# Patient Record
Sex: Female | Born: 1975 | Race: White | Hispanic: No | Smoking: Current every day smoker
Health system: Southern US, Community
[De-identification: ages and names within clinical notes are randomized; demographics above are authoritative.]

## PROBLEM LIST (undated history)

## (undated) ENCOUNTER — Inpatient Hospital Stay (HOSPITAL_COMMUNITY): Payer: Self-pay

## (undated) DIAGNOSIS — T7840XA Allergy, unspecified, initial encounter: Secondary | ICD-10-CM

## (undated) DIAGNOSIS — F32A Depression, unspecified: Secondary | ICD-10-CM

## (undated) DIAGNOSIS — E559 Vitamin D deficiency, unspecified: Secondary | ICD-10-CM

## (undated) DIAGNOSIS — R87629 Unspecified abnormal cytological findings in specimens from vagina: Secondary | ICD-10-CM

## (undated) DIAGNOSIS — F329 Major depressive disorder, single episode, unspecified: Secondary | ICD-10-CM

## (undated) DIAGNOSIS — F419 Anxiety disorder, unspecified: Secondary | ICD-10-CM

## (undated) DIAGNOSIS — Z8639 Personal history of other endocrine, nutritional and metabolic disease: Secondary | ICD-10-CM

## (undated) HISTORY — PX: CHOLECYSTECTOMY: SHX55

## (undated) HISTORY — DX: Vitamin D deficiency, unspecified: E55.9

## (undated) HISTORY — DX: Depression, unspecified: F32.A

## (undated) HISTORY — DX: Anxiety disorder, unspecified: F41.9

## (undated) HISTORY — DX: Unspecified abnormal cytological findings in specimens from vagina: R87.629

## (undated) HISTORY — DX: Major depressive disorder, single episode, unspecified: F32.9

## (undated) HISTORY — DX: Allergy, unspecified, initial encounter: T78.40XA

## (undated) HISTORY — DX: Personal history of other endocrine, nutritional and metabolic disease: Z86.39

---

## 2012-08-31 ENCOUNTER — Ambulatory Visit (INDEPENDENT_AMBULATORY_CARE_PROVIDER_SITE_OTHER): Payer: 59 | Admitting: Family Medicine

## 2012-08-31 VITALS — BP 118/90 | HR 86 | Temp 98.5°F | Resp 16 | Ht 67.0 in | Wt 230.0 lb

## 2012-08-31 DIAGNOSIS — Z3040 Encounter for surveillance of contraceptives, unspecified: Secondary | ICD-10-CM

## 2012-08-31 MED ORDER — NORETHINDRONE 0.35 MG PO TABS: 1.0000 | ORAL_TABLET | Freq: Every day | ORAL | Status: DC

## 2012-08-31 NOTE — Progress Notes (Signed)
[facility] [address] [place] [address] [phone]  Date:  08/31/2012   Name:  Lauren Bowers   DOB:  [birth year]   MRN:  [id number]  PCP:  No primary provider on file.    Chief Complaint: Contraception   History of Present Illness:  Lauren Bowers is a 37 y.o. very pleasant female patient who presents with the following:  She is here today for a refill of her OCP.  She is trying to establish with a general doctor.  Normal pap last year- she did have an abnormal many years ago, but several normal since then.   She is still smoking - about 5 cigarettes a day- she is not ready to quit right now. She had heard that smoking over the age of 2 could increase her risks with OCP use and is concerned about this.  She is also interested in possibly pursuing a more permament form of contraception such as an IUD or tubal ligation.    She has a 38 year old daughter, and then had 2 still births. She does not want to become pregnant again for this reason.    Otherwise she is generally healthy  There are no active problems to display for this patient.   Past Medical History  Diagnosis Date  . Allergy   . Anxiety   . Depression     Past Surgical History  Procedure Laterality Date  . Cholecystectomy      History  Substance Use Topics  . Smoking status: Current Every Day Smoker -- 0.50 packs/day    Types: Cigarettes  . Smokeless tobacco: Not on file  . Alcohol Use: Yes    Family History  Problem Relation Age of Onset  . Heart disease Father   . Ovarian cancer Maternal Grandmother   . Heart disease Maternal Grandfather     Allergies  Allergen Reactions  . Bactrim (Sulfamethoxazole W-Trimethoprim) Hives    Medication list has been reviewed and updated.  No current outpatient prescriptions on file prior to visit.   No current facility-administered medications on file prior to visit.    Review of Systems:  As per HPI- otherwise  negative.   Physical Examination: Filed Vitals:   08/31/12 1221  BP: 118/90  Pulse: 86  Temp: 98.5 F (36.9 C)  Resp: 16   Filed Vitals:   08/31/12 1221  Height: 5\' 7"  (1.702 m)  Weight: 230 lb (104.327 kg)   Body mass index is 36.01 kg/(m^2). Ideal Body Weight: Weight in (lb) to have BMI = 25: 159.3  GEN: WDWN, NAD, Non-toxic, A & O x 3, obese HEENT: Atraumatic, Normocephalic. Neck supple. No masses, No LAD. Ears and Nose: No external deformity. CV: RRR, No M/G/R. No JVD. No thrill. No extra heart sounds. PULM: CTA B, no wheezes, crackles, rhonchi. No retractions. No resp. distress. No accessory muscle use. EXTR: No c/c/e NEURO Normal gait.  PSYCH: Normally interactive. Conversant. Not depressed or anxious appearing.  Calm demeanor.    Assessment and Plan: Contraceptive use - Plan: norethindrone (ORTHO MICRONOR) 0.35 MG tablet  Went over WHO tables- use of combined pill in smokers over 19 years old gets a category 3, meaning that the dangers usually outweigh benefits.  Went over other options including depo and POP.  She would like to use the progesterone only pill, and understands that it can be slightly less effective that the combined pill.  Encouraged condom use as well.   See patient instructions  for more details.     Signed Abbe Amsterdam, MD

## 2012-08-31 NOTE — Patient Instructions (Addendum)
Switch to the progesterone only pill at the time you would start your next pack. Remember that this pill is more sensitive, and needs to be taken at the same time daily without fail.  I would recommend that you use condoms as well (especailly the first month) if avoiding pregnancy is very important.  If you are interested in something more permanent, please consult with an OB- GYN  Some good options in town . . .  [facility]  You can also consult the world health organization tables (avaialbe online) regarding which contraceptive options are best for you.    Please work on quitting smoking!  If you are able to quit and want to go back on a combined pill please let me know.

## 2012-09-19 ENCOUNTER — Telehealth: Payer: Self-pay

## 2012-09-19 NOTE — Telephone Encounter (Signed)
Called her back- no answer but LMOM.  I will try her again tomorrow

## 2012-09-19 NOTE — Telephone Encounter (Signed)
Do you want to change her OCP? Or have her continue and give more time?

## 2012-09-19 NOTE — Telephone Encounter (Signed)
Lauren Bowers is calling because she was seen by Dr Patsy Lager not long ago and her birth control was switched now she is on her 2nd week of her period is the way she put it and she is wanting to know if this is normal  Call back number is [phone]

## 2012-10-17 ENCOUNTER — Telehealth: Payer: Self-pay

## 2012-10-17 NOTE — Telephone Encounter (Signed)
Called her- LMOM.  Will try her tomorrow

## 2012-10-17 NOTE — Telephone Encounter (Signed)
Patient is having her period for second month in row after starting new medication of BCP.    [phone]

## 2012-10-18 NOTE — Telephone Encounter (Signed)
Called her back.  She has bleeding every 2 weeks since she changed to the progesterone OCP.  She has not missed any doses and is otherwise doing well. She would like to quit smoking so she can go back on a combined OCP. She will contact her insurance company re: chantix and let me know if she wants me to rx this for her.  Let her know that I am happy to put her back on a combined OCP when she has quit smoking- she is motivated to quit.

## 2013-02-05 ENCOUNTER — Ambulatory Visit (INDEPENDENT_AMBULATORY_CARE_PROVIDER_SITE_OTHER): Payer: 59 | Admitting: General Practice

## 2013-02-05 ENCOUNTER — Encounter (INDEPENDENT_AMBULATORY_CARE_PROVIDER_SITE_OTHER): Payer: Self-pay

## 2013-02-05 VITALS — BP 138/92 | HR 88 | Temp 98.3°F | Ht 68.0 in | Wt 232.0 lb

## 2013-02-05 DIAGNOSIS — N390 Urinary tract infection, site not specified: Secondary | ICD-10-CM

## 2013-02-05 DIAGNOSIS — R300 Dysuria: Secondary | ICD-10-CM

## 2013-02-05 LAB — POCT UA - MICROSCOPIC ONLY

## 2013-02-05 LAB — POCT URINALYSIS DIPSTICK
Bilirubin, UA: NEGATIVE
Glucose, UA: NEGATIVE
Ketones, UA: NEGATIVE
Spec Grav, UA: 1.01

## 2013-02-05 MED ORDER — CIPROFLOXACIN HCL 500 MG PO TABS: 500.0000 mg | ORAL_TABLET | Freq: Two times a day (BID) | ORAL | Status: DC

## 2013-02-05 NOTE — Progress Notes (Signed)
  Subjective:    Patient ID: Lauren Bowers, female    DOB: 1975-12-19, 37 y.o.   MRN: [id number]  Urinary Tract Infection  This is a new problem. The current episode started 1 to 4 weeks ago. The problem occurs every urination. The problem has been unchanged. The quality of the pain is described as aching and burning. The pain is at a severity of 2/10. There has been no fever. She is sexually active. There is no history of pyelonephritis. Associated symptoms include frequency and urgency. Pertinent negatives include no chills, flank pain or hematuria. She has tried nothing for the symptoms. Her past medical history is significant for recurrent UTIs. history of UTI years ago      Review of Systems  Constitutional: Negative for fever and chills.  Respiratory: Negative for chest tightness and shortness of breath.   Cardiovascular: Negative for chest pain and palpitations.  Gastrointestinal: Positive for abdominal pain.  Genitourinary: Positive for urgency and frequency. Negative for hematuria and flank pain.  Neurological: Negative for dizziness, weakness and headaches.       Objective:   Physical Exam  Constitutional: She is oriented to person, place, and time. She appears well-developed and well-nourished.  Cardiovascular: Normal rate, regular rhythm and normal heart sounds.   Pulmonary/Chest: Effort normal and breath sounds normal.  Abdominal: Soft. Bowel sounds are normal. She exhibits no distension. There is tenderness in the suprapubic area. There is no CVA tenderness.  Neurological: She is alert and oriented to person, place, and time.  Skin: Skin is warm and dry.  Psychiatric: She has a normal mood and affect.    Results for orders placed in visit on 02/05/13  POCT URINALYSIS DIPSTICK      Result Value Range   Color, UA yellow     Clarity, UA clear     Glucose, UA neg     Bilirubin, UA neg     Ketones, UA neg     Spec Grav, UA 1.010     Blood, UA mod     pH, UA 7.5     Protein, UA small     Urobilinogen, UA negative     Nitrite, UA neg     Leukocytes, UA Trace    POCT UA - MICROSCOPIC ONLY      Result Value Range   WBC, Ur, HPF, POC 10-15     RBC, urine, microscopic 20-30     Bacteria, U Microscopic mod     Mucus, UA trace     Epithelial cells, urine per micros few     Crystals, Ur, HPF, POC neg     Casts, Ur, LPF, POC neg           Assessment & Plan:  1. Dysuria  - POCT urinalysis dipstick - POCT UA - Microscopic Only - Urine culture  2. UTI (urinary tract infection)  - ciprofloxacin (CIPRO) 500 MG tablet; Take 1 tablet (500 mg total) by mouth 2 (two) times daily.  Dispense: 20 tablet; Refill: 0 -Increase fluid intake AZO over the counter X2 days Frequent voiding Proper perineal hygiene RTO prn Patient verbalized understanding Coralie Keens, FNP-C

## 2013-02-05 NOTE — Patient Instructions (Signed)
Urinary Tract Infection  Urinary tract infections (UTIs) can develop anywhere along your urinary tract. Your urinary tract is your body's drainage system for removing wastes and extra water. Your urinary tract includes two kidneys, two ureters, a bladder, and a urethra. Your kidneys are a pair of bean-shaped organs. Each kidney is about the size of your fist. They are located below your ribs, one on each side of your spine.  CAUSES  Infections are caused by microbes, which are microscopic organisms, including fungi, viruses, and bacteria. These organisms are so small that they can only be seen through a microscope. Bacteria are the microbes that most commonly cause UTIs.  SYMPTOMS   Symptoms of UTIs may vary by age and gender of the patient and by the location of the infection. Symptoms in young women typically include a frequent and intense urge to urinate and a painful, burning feeling in the bladder or urethra during urination. Older women and men are more likely to be tired, shaky, and weak and have muscle aches and abdominal pain. A fever may mean the infection is in your kidneys. Other symptoms of a kidney infection include pain in your back or sides below the ribs, nausea, and vomiting.  DIAGNOSIS  To diagnose a UTI, your caregiver will ask you about your symptoms. Your caregiver also will ask to provide a urine sample. The urine sample will be tested for bacteria and white blood cells. White blood cells are made by your body to help fight infection.  TREATMENT   Typically, UTIs can be treated with medication. Because most UTIs are caused by a bacterial infection, they usually can be treated with the use of antibiotics. The choice of antibiotic and length of treatment depend on your symptoms and the type of bacteria causing your infection.  HOME CARE INSTRUCTIONS   If you were prescribed antibiotics, take them exactly as your caregiver instructs you. Finish the medication even if you feel better after you  have only taken some of the medication.   Drink enough water and fluids to keep your urine clear or pale yellow.   Avoid caffeine, tea, and carbonated beverages. They tend to irritate your bladder.   Empty your bladder often. Avoid holding urine for long periods of time.   Empty your bladder before and after sexual intercourse.   After a bowel movement, women should cleanse from front to back. Use each tissue only once.  SEEK MEDICAL CARE IF:    You have back pain.   You develop a fever.   Your symptoms do not begin to resolve within 3 days.  SEEK IMMEDIATE MEDICAL CARE IF:    You have severe back pain or lower abdominal pain.   You develop chills.   You have nausea or vomiting.   You have continued burning or discomfort with urination.  MAKE SURE YOU:    Understand these instructions.   Will watch your condition.   Will get help right away if you are not doing well or get worse.  Document Released: 12/21/2004 Document Revised: 09/12/2011 Document Reviewed: 04/21/2011  ExitCare Patient Information 2014 [employer].

## 2013-02-07 LAB — URINE CULTURE

## 2013-08-06 ENCOUNTER — Other Ambulatory Visit: Payer: Self-pay | Admitting: Family Medicine

## 2013-08-07 ENCOUNTER — Ambulatory Visit: Payer: Self-pay | Admitting: Family Medicine

## 2013-08-07 VITALS — BP 122/84 | HR 92 | Temp 98.2°F | Resp 16 | Ht 64.75 in | Wt 234.0 lb

## 2013-08-07 DIAGNOSIS — Z309 Encounter for contraceptive management, unspecified: Secondary | ICD-10-CM

## 2013-08-07 DIAGNOSIS — Z720 Tobacco use: Secondary | ICD-10-CM

## 2013-08-07 MED ORDER — NORETHINDRONE 0.35 MG PO TABS: 1.0000 | ORAL_TABLET | Freq: Every day | ORAL | Status: DC

## 2013-08-07 NOTE — Progress Notes (Signed)
Subjective:    Patient ID: Lauren Bowers, female    DOB: Oct 07, 1975, 38 y.o.   MRN: [id number] This chart was scribed for Wardell Honour, MD by Anastasia Pall, ED Scribe. This patient was seen in room 11 and the patient's care was started at 7:40 PM.  08/07/2013  Medication Refill  HPI Lauren Bowers is a 38 y.o. female Pt presents for medication refill and vaginal bleeding.   She reports changing from Estrogen-Progesterone pill to just Progesterone only pill. She reports having 2 periods last month. She states her periods had been sporadic, until last month. She denies having missed any doses. She reports with her last period her cramping was worse than normal, but reports her flow has been unchanged, if anything may be a bit lighter. She states her last period lasted, and states she is still spotting. She denies any unusual tenderness and pain.   She denies knowing when she last had a physical. Her pap smear was 3 years ago. She had an abnormal pap smear in her early 58s. She denies having freezing of her cervix. She denies having mammogram. She has had colonoscopy 11 years ago with negative results.   She reports h/o seasonal allergies, anxiety, and depression. She reports h/o cholecystectomy.   Her mother is living at 18 years old. Her mother has h/o kidney stones, hysterectomy. She reports her grandmother has h/o ovarian cancer. Her father is living at 27 years old with heart issues. He has h/o heart attack 4 years ago. He had stents placed. He has h/o HTN, hypercholesterolemia, and MS. She denies having siblings.   She reports smoking 5-7 cigarettes a day, reduced recently to 4 a day. She is married with one child. She had 2 still births, each at 22 weeks, states that is when depression started. 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 She denies any other medications.   Review of Systems  Constitutional: Negative for fever.  Gastrointestinal: Negative for nausea,  vomiting, abdominal pain and diarrhea.  Genitourinary: Positive for vaginal bleeding and menstrual problem. Negative for vaginal discharge and vaginal pain.   Past Medical History  Diagnosis Date  . Allergy   . Anxiety   . Depression    Allergies  Allergen Reactions  . Bactrim [Sulfamethoxazole-Trimethoprim] Hives   Current Outpatient Prescriptions  Medication Sig Dispense Refill  . amoxicillin (AMOXIL) 875 MG tablet Take 1 twice a day X 10 days. 20 tablet 0  . norethindrone (MICRONOR,CAMILA,ERRIN) 0.35 MG tablet Take 1 tablet (0.35 mg total) by mouth daily. 84 tablet 2   No current facility-administered medications for this visit.      Objective:    BP 122/84  Pulse 92  Temp(Src) 98.2 F (36.8 C) (Oral)  Resp 16  Ht 5' 4.75" (1.645 m)  Wt 234 lb (106.142 kg)  BMI 39.22 kg/m2  SpO2 97%  LMP 08/02/2013  Physical Exam  Nursing note and vitals reviewed. Constitutional: She is oriented to person, place, and time. She appears well-developed and well-nourished. No distress.  HENT:  Head: Normocephalic and atraumatic.  Eyes: EOM are normal.  Neck: Neck supple.  Cardiovascular: Normal rate, regular rhythm and normal heart sounds.   No murmur heard. Pulmonary/Chest: Effort normal and breath sounds normal. No respiratory distress. She has no wheezes. She has no rales.  Abdominal: Soft. Bowel sounds are normal. She exhibits no distension. There is no tenderness. There is no rebound.  Musculoskeletal: Normal range of motion.  Neurological:  She is alert and oriented to person, place, and time.  Skin: Skin is warm and dry.  Psychiatric: She has a normal mood and affect. Her behavior is normal.       Assessment & Plan:  Contraception management  Tobacco abuse   1. Contraception management: refill of Micronor provided; current smoker.  Due for repeat pelvic and breast exam and patient advised of such; plans to return.  Discussed other contraception options during visit  including IUD, Nexplanon.   2. Tobacco abuse: estrogen contraception contraindicated; encourage cessation.  Meds ordered this encounter  Medications  . DISCONTD: norethindrone (MICRONOR,CAMILA,ERRIN) 0.35 MG tablet    Sig: Take 1 tablet (0.35 mg total) by mouth daily. PATIENT NEEDS OFFICE VISIT FOR ADDITIONAL REFILLS    Dispense:  28 tablet    Refill:  11    No Follow-up on file.   I personally performed the services described in this documentation, which was scribed in my presence.  The recorded information has been reviewed and is accurate.  Reginia Forts, M.D.  [facility] [address] [place], [place]  [address] [phone] phone [phone] fax

## 2013-08-07 NOTE — Patient Instructions (Signed)
Contraception Choices Contraception (birth control) is the use of any methods or devices to prevent pregnancy. Below are some methods to help avoid pregnancy. HORMONAL METHODS   Contraceptive implant This is a thin, plastic tube containing progesterone hormone. It does not contain estrogen hormone. Your health care provider inserts the tube in the inner part of the upper arm. The tube can remain in place for up to 3 years. After 3 years, the implant must be removed. The implant prevents the ovaries from releasing an egg (ovulation), thickens the cervical mucus to prevent sperm from entering the uterus, and thins the lining of the inside of the uterus.  Progesterone-only injections These injections are given every 3 months by your health care provider to prevent pregnancy. This synthetic progesterone hormone stops the ovaries from releasing eggs. It also thickens cervical mucus and changes the uterine lining. This makes it harder for sperm to survive in the uterus.  Birth control pills These pills contain estrogen and progesterone hormone. They work by preventing the ovaries from releasing eggs (ovulation). They also cause the cervical mucus to thicken, preventing the sperm from entering the uterus. Birth control pills are prescribed by a health care provider.Birth control pills can also be used to treat heavy periods.  Minipill This type of birth control pill contains only the progesterone hormone. They are taken every day of each month and must be prescribed by your health care provider.  Birth control patch The patch contains hormones similar to those in birth control pills. It must be changed once a week and is prescribed by a health care provider.  Vaginal ring The ring contains hormones similar to those in birth control pills. It is left in the vagina for 3 weeks, removed for 1 week, and then a new one is put back in place. The patient must be comfortable inserting and removing the ring from the  vagina.A health care provider's prescription is necessary.  Emergency contraception Emergency contraceptives prevent pregnancy after unprotected sexual intercourse. This pill can be taken right after sex or up to 5 days after unprotected sex. It is most effective the sooner you take the pills after having sexual intercourse. Most emergency contraceptive pills are available without a prescription. Check with your pharmacist. Do not use emergency contraception as your only form of birth control. BARRIER METHODS   Female condom This is a thin sheath (latex or rubber) that is worn over the penis during sexual intercourse. It can be used with spermicide to increase effectiveness.  Female condom. This is a soft, loose-fitting sheath that is put into the vagina before sexual intercourse.  Diaphragm This is a soft, latex, dome-shaped barrier that must be fitted by a health care provider. It is inserted into the vagina, along with a spermicidal jelly. It is inserted before intercourse. The diaphragm should be left in the vagina for 6 to 8 hours after intercourse.  Cervical cap This is a round, soft, latex or plastic cup that fits over the cervix and must be fitted by a health care provider. The cap can be left in place for up to 48 hours after intercourse.  Sponge This is a soft, circular piece of polyurethane foam. The sponge has spermicide in it. It is inserted into the vagina after wetting it and before sexual intercourse.  Spermicides These are chemicals that kill or block sperm from entering the cervix and uterus. They come in the form of creams, jellies, suppositories, foam, or tablets. They do not require a   prescription. They are inserted into the vagina with an applicator before having sexual intercourse. The process must be repeated every time you have sexual intercourse. INTRAUTERINE CONTRACEPTION  Intrauterine device (IUD) This is a T-shaped device that is put in a woman's uterus during a  menstrual period to prevent pregnancy. There are 2 types:  Copper IUD This type of IUD is wrapped in copper wire and is placed inside the uterus. Copper makes the uterus and fallopian tubes produce a fluid that kills sperm. It can stay in place for 10 years.  Hormone IUD This type of IUD contains the hormone progestin (synthetic progesterone). The hormone thickens the cervical mucus and prevents sperm from entering the uterus, and it also thins the uterine lining to prevent implantation of a fertilized egg. The hormone can weaken or kill the sperm that get into the uterus. It can stay in place for 3 5 years, depending on which type of IUD is used. PERMANENT METHODS OF CONTRACEPTION  Female tubal ligation This is when the woman's fallopian tubes are surgically sealed, tied, or blocked to prevent the egg from traveling to the uterus.  Hysteroscopic sterilization This involves placing a small coil or insert into each fallopian tube. Your doctor uses a technique called hysteroscopy to do the procedure. The device causes scar tissue to form. This results in permanent blockage of the fallopian tubes, so the sperm cannot fertilize the egg. It takes about 3 months after the procedure for the tubes to become blocked. You must use another form of birth control for these 3 months.  Female sterilization This is when the female has the tubes that carry sperm tied off (vasectomy).This blocks sperm from entering the vagina during sexual intercourse. After the procedure, the man can still ejaculate fluid (semen). NATURAL PLANNING METHODS  Natural family planning This is not having sexual intercourse or using a barrier method (condom, diaphragm, cervical cap) on days the woman could become pregnant.  Calendar method This is keeping track of the length of each menstrual cycle and identifying when you are fertile.  Ovulation method This is avoiding sexual intercourse during ovulation.  Symptothermal method This is  avoiding sexual intercourse during ovulation, using a thermometer and ovulation symptoms.  Post ovulation method This is timing sexual intercourse after you have ovulated. Regardless of which type or method of contraception you choose, it is important that you use condoms to protect against the transmission of sexually transmitted infections (STIs). Talk with your health care provider about which form of contraception is most appropriate for you. Document Released: 03/13/2005 Document Revised: 11/13/2012 Document Reviewed: 09/05/2012 [employer] Patient Information 2014 [employer].  

## 2013-11-22 ENCOUNTER — Other Ambulatory Visit: Payer: Self-pay | Admitting: Family Medicine

## 2013-11-22 ENCOUNTER — Emergency Department (INDEPENDENT_AMBULATORY_CARE_PROVIDER_SITE_OTHER)
Admission: EM | Admit: 2013-11-22 | Discharge: 2013-11-22 | Disposition: A | Discharge status: Home / Self Care | Payer: Managed Care, Other (non HMO) | Source: Home / Self Care | Attending: Family Medicine | Admitting: Family Medicine

## 2013-11-22 ENCOUNTER — Encounter: Payer: Self-pay | Admitting: Emergency Medicine

## 2013-11-22 DIAGNOSIS — N898 Other specified noninflammatory disorders of vagina: Secondary | ICD-10-CM

## 2013-11-22 DIAGNOSIS — J029 Acute pharyngitis, unspecified: Secondary | ICD-10-CM

## 2013-11-22 LAB — POCT RAPID STREP A (OFFICE): Rapid Strep A Screen: NEGATIVE

## 2013-11-22 NOTE — ED Notes (Signed)
Lauren Bowers reports sore throat, body aches and cough for about 1 week. She also reports vaginal discharge for 1 week. Her husband advised her he has been having an affair.

## 2013-11-22 NOTE — ED Provider Notes (Signed)
Lauren Bowers is a 38 y.o. female who presents to Urgent Care today for sore throat and vaginal discharge. 1) patient has a 10 day history of sore throat associated with a mild cough. No fevers or chills nausea vomiting or diarrhea. No shortness of breath. She's tried over-the-counter medications which help.  2) patient additionally notes a 10 day history of vaginal discharge. She said it last night that her husband has been having an affair and is worried about STDs. She denies any pelvic pain or vaginal itching or discomfort. She feels well otherwise.   Past Medical History  Diagnosis Date  . Allergy   . Anxiety   . Depression    History  Substance Use Topics  . Smoking status: Current Every Day Smoker -- 0.50 packs/day for 20 years    Types: Cigarettes    Start date: 10/23/1992  . Smokeless tobacco: Not on file  . Alcohol Use: Yes   ROS as above Medications: No current facility-administered medications for this encounter.   Current Outpatient Prescriptions  Medication Sig Dispense Refill  . norethindrone (MICRONOR,CAMILA,ERRIN) 0.35 MG tablet Take 1 tablet (0.35 mg total) by mouth daily. PATIENT NEEDS OFFICE VISIT FOR ADDITIONAL REFILLS  28 tablet  11    Exam:  BP 143/98  Pulse 101  Temp(Src) 98.5 F (36.9 C) (Oral)  Ht 5\' 6"  (1.676 m)  Wt 222 lb (100.699 kg)  BMI 35.85 kg/m2  SpO2 97%  LMP 11/01/2013 Gen: Well NAD HEENT: EOMI,  MMM posterior pharynx is erythematous with exudate. Tympanic membranes are normal appearing bilaterally. Lungs: Normal work of breathing. CTABL Heart: RRR no MRG Abd: NABS, Soft. Nondistended, Nontender Exts: Brisk capillary refill, warm and well perfused. GYN: Normal external genitalia. Vaginal canal with thin white discharge. Normal-appearing cervix. Nontender.   No results found for this or any previous visit (from the past 24 hour(s)). No results found.  Assessment and Plan: 38 y.o. female with  1) sore throat: Pharyngitis. Strep a  strep test negative. Culture pending. Likely viral. NSAIDs as needed. 2) vaginitis: Cytology and wet prep pending. Will call patient with results. HIV and RPR also pending.  Discussed warning signs or symptoms. Please see discharge instructions. Patient expresses understanding.   This note was created using Systems analyst. Any transcription errors are unintended.    Gregor Hams, MD 11/22/13 1322

## 2013-11-22 NOTE — Discharge Instructions (Signed)
Thank you for coming in today. We will call with results from both the throat culture and the STD tests. Take up to 2 aleve twice daily for sore throat.  Come back as needed.  Call or go to the emergency room if you get worse, have trouble breathing, have chest pains, or palpitations.  If your belly pain worsens, or you have high fever, bad vomiting, blood in your stool or black tarry stool go to the Emergency Room.   Pharyngitis Pharyngitis is redness, pain, and swelling (inflammation) of your pharynx.  CAUSES  Pharyngitis is usually caused by infection. Most of the time, these infections are from viruses (viral) and are part of a cold. However, sometimes pharyngitis is caused by bacteria (bacterial). Pharyngitis can also be caused by allergies. Viral pharyngitis may be spread from person to person by coughing, sneezing, and personal items or utensils (cups, forks, spoons, toothbrushes). Bacterial pharyngitis may be spread from person to person by more intimate contact, such as kissing.  SIGNS AND SYMPTOMS  Symptoms of pharyngitis include:   Sore throat.   Tiredness (fatigue).   Low-grade fever.   Headache.  Joint pain and muscle aches.  Skin rashes.  Swollen lymph nodes.  Plaque-like film on throat or tonsils (often seen with bacterial pharyngitis). DIAGNOSIS  Your health care provider will ask you questions about your illness and your symptoms. Your medical history, along with a physical exam, is often all that is needed to diagnose pharyngitis. Sometimes, a rapid strep test is done. Other lab tests may also be done, depending on the suspected cause.  TREATMENT  Viral pharyngitis will usually get better in 3-4 days without the use of medicine. Bacterial pharyngitis is treated with medicines that kill germs (antibiotics).  HOME CARE INSTRUCTIONS   Drink enough water and fluids to keep your urine clear or pale yellow.   Only take over-the-counter or prescription medicines  as directed by your health care provider:   If you are prescribed antibiotics, make sure you finish them even if you start to feel better.   Do not take aspirin.   Get lots of rest.   Gargle with 8 oz of salt water ( tsp of salt per 1 qt of water) as often as every 1-2 hours to soothe your throat.   Throat lozenges (if you are not at risk for choking) or sprays may be used to soothe your throat. SEEK MEDICAL CARE IF:   You have large, tender lumps in your neck.  You have a rash.  You cough up green, yellow-brown, or bloody spit. SEEK IMMEDIATE MEDICAL CARE IF:   Your neck becomes stiff.  You drool or are unable to swallow liquids.  You vomit or are unable to keep medicines or liquids down.  You have severe pain that does not go away with the use of recommended medicines.  You have trouble breathing (not caused by a stuffy nose). MAKE SURE YOU:   Understand these instructions.  Will watch your condition.  Will get help right away if you are not doing well or get worse. Document Released: 03/13/2005 Document Revised: 01/01/2013 Document Reviewed: 11/18/2012 [facility] Patient Information 2015 [place], [place]. This information is not intended to replace advice given to you by your health care provider. Make sure you discuss any questions you have with your health care provider.   Vaginitis Vaginitis is an inflammation of the vagina. It is most often caused by a change in the normal balance of the bacteria and yeast  that live in the vagina. This change in balance causes an overgrowth of certain bacteria or yeast, which causes the inflammation. There are different types of vaginitis, but the most common types are:  Bacterial vaginosis.  Yeast infection (candidiasis).  Trichomoniasis vaginitis. This is a sexually transmitted infection (STI).  Viral vaginitis.  Atropic vaginitis.  Allergic vaginitis. CAUSES  The cause depends on the type of vaginitis. Vaginitis can  be caused by:  Bacteria (bacterial vaginosis).  Yeast (yeast infection).  A parasite (trichomoniasis vaginitis)  A virus (viral vaginitis).  Low hormone levels (atrophic vaginitis). Low hormone levels can occur during pregnancy, breastfeeding, or after menopause.  Irritants, such as bubble baths, scented tampons, and feminine sprays (allergic vaginitis). Other factors can change the normal balance of the yeast and bacteria that live in the vagina. These include:  Antibiotic medicines.  Poor hygiene.  Diaphragms, vaginal sponges, spermicides, birth control pills, and intrauterine devices (IUD).  Sexual intercourse.  Infection.  Uncontrolled diabetes.  A weakened immune system. SYMPTOMS  Symptoms can vary depending on the cause of the vaginitis. Common symptoms include:  Abnormal vaginal discharge.  The discharge is white, gray, or yellow with bacterial vaginosis.  The discharge is thick, white, and cheesy with a yeast infection.  The discharge is frothy and yellow or greenish with trichomoniasis.  A bad vaginal odor.  The odor is fishy with bacterial vaginosis.  Vaginal itching, pain, or swelling.  Painful intercourse.  Pain or burning when urinating. Sometimes, there are no symptoms. TREATMENT  Treatment will vary depending on the type of infection.   Bacterial vaginosis and trichomoniasis are often treated with antibiotic creams or pills.  Yeast infections are often treated with antifungal medicines, such as vaginal creams or suppositories.  Viral vaginitis has no cure, but symptoms can be treated with medicines that relieve discomfort. Your sexual partner should be treated as well.  Atrophic vaginitis may be treated with an estrogen cream, pill, suppository, or vaginal ring. If vaginal dryness occurs, lubricants and moisturizing creams may help. You may be told to avoid scented soaps, sprays, or douches.  Allergic vaginitis treatment involves quitting the  use of the product that is causing the problem. Vaginal creams can be used to treat the symptoms. HOME CARE INSTRUCTIONS   Take all medicines as directed by your caregiver.  Keep your genital area clean and dry. Avoid soap and only rinse the area with water.  Avoid douching. It can remove the healthy bacteria in the vagina.  Do not use tampons or have sexual intercourse until your vaginitis has been treated. Use sanitary pads while you have vaginitis.  Wipe from front to back. This avoids the spread of bacteria from the rectum to the vagina.  Let air reach your genital area.  Wear cotton underwear to decrease moisture buildup.  Avoid wearing underwear while you sleep until your vaginitis is gone.  Avoid tight pants and underwear or nylons without a cotton panel.  Take off wet clothing (especially bathing suits) as soon as possible.  Use mild, non-scented products. Avoid using irritants, such as:  Scented feminine sprays.  Fabric softeners.  Scented detergents.  Scented tampons.  Scented soaps or bubble baths.  Practice safe sex and use condoms. Condoms may prevent the spread of trichomoniasis and viral vaginitis. SEEK MEDICAL CARE IF:   You have abdominal pain.  You have a fever or persistent symptoms for more than 2-3 days.  You have a fever and your symptoms suddenly get worse. Document Released:  01/08/2007 Document Revised: 12/06/2011 Document Reviewed: 08/24/2011 [employer] Patient Information 2015 [place], [place]. This information is not intended to replace advice given to you by your health care provider. Make sure you discuss any questions you have with your health care provider.

## 2013-11-23 LAB — RPR

## 2013-11-23 LAB — HIV ANTIBODY (ROUTINE TESTING W REFLEX): HIV: NONREACTIVE

## 2013-11-25 ENCOUNTER — Telehealth (HOSPITAL_COMMUNITY): Payer: Self-pay | Admitting: Family Medicine

## 2013-11-25 LAB — STREP A DNA PROBE: GASP: POSITIVE

## 2013-11-25 NOTE — ED Notes (Signed)
Called patient regarding lab results.  Left a message.  Gregor Hams, MD 11/25/13 [phone]

## 2013-11-26 LAB — WET PREP, GENITAL
Clue Cells Wet Prep HPF POC: NONE SEEN
Trich, Wet Prep: NONE SEEN
WBC WET PREP: NONE SEEN
Yeast Wet Prep HPF POC: NONE SEEN

## 2013-11-27 ENCOUNTER — Telehealth: Payer: Self-pay | Admitting: *Deleted

## 2013-11-27 LAB — GC/CHLAMYDIA PROBE AMP
CT Probe RNA: NEGATIVE
GC PROBE AMP APTIMA: NEGATIVE

## 2013-11-27 MED ORDER — AMOXICILLIN 875 MG PO TABS: ORAL_TABLET | ORAL | Status: DC

## 2013-11-27 NOTE — ED Notes (Signed)
I reviewed lab results. Throat culture came back positive for strep. All other tests for GC, chlamydia, and vaginal wet prep were all negative. We'll therefore treat with amoxicillin 875 mg twice a day x10 days.-I have  eRx'd to her pharmacy. Followup with PCP if no better 7-10 days, sooner if worse or new symptoms.  Jacqulyn Cane, MD 11/27/13 [phone]

## 2013-12-02 ENCOUNTER — Other Ambulatory Visit: Payer: Self-pay

## 2013-12-02 MED ORDER — NORETHINDRONE 0.35 MG PO TABS: 1.0000 | ORAL_TABLET | Freq: Every day | ORAL | Status: DC

## 2014-02-06 ENCOUNTER — Encounter: Payer: Self-pay | Admitting: Physician Assistant

## 2014-02-06 ENCOUNTER — Ambulatory Visit (INDEPENDENT_AMBULATORY_CARE_PROVIDER_SITE_OTHER): Payer: Managed Care, Other (non HMO) | Admitting: Physician Assistant

## 2014-02-06 VITALS — BP 133/79 | HR 88 | Ht 66.0 in | Wt 221.0 lb

## 2014-02-06 DIAGNOSIS — F329 Major depressive disorder, single episode, unspecified: Secondary | ICD-10-CM | POA: Insufficient documentation

## 2014-02-06 DIAGNOSIS — Z1239 Encounter for other screening for malignant neoplasm of breast: Secondary | ICD-10-CM

## 2014-02-06 DIAGNOSIS — F419 Anxiety disorder, unspecified: Secondary | ICD-10-CM | POA: Insufficient documentation

## 2014-02-06 DIAGNOSIS — Z3009 Encounter for other general counseling and advice on contraception: Secondary | ICD-10-CM | POA: Insufficient documentation

## 2014-02-06 DIAGNOSIS — J069 Acute upper respiratory infection, unspecified: Secondary | ICD-10-CM

## 2014-02-06 DIAGNOSIS — N393 Stress incontinence (female) (male): Secondary | ICD-10-CM

## 2014-02-06 DIAGNOSIS — Z716 Tobacco abuse counseling: Secondary | ICD-10-CM | POA: Insufficient documentation

## 2014-02-06 DIAGNOSIS — F32A Depression, unspecified: Secondary | ICD-10-CM

## 2014-02-06 MED ORDER — NORETHINDRONE 0.35 MG PO TABS: 1.0000 | ORAL_TABLET | Freq: Every day | ORAL | Status: DC

## 2014-02-06 MED ORDER — BENZONATATE 200 MG PO CAPS: 200.0000 mg | ORAL_CAPSULE | Freq: Two times a day (BID) | ORAL | Status: DC | PRN

## 2014-02-06 MED ORDER — NORETHINDRONE 0.35 MG PO TABS
1.0000 | ORAL_TABLET | Freq: Every day | ORAL | Status: DC
Start: 2014-02-06 — End: 2014-03-05

## 2014-02-06 MED ORDER — HYDROCODONE-HOMATROPINE 5-1.5 MG/5ML PO SYRP: 5.0000 mL | ORAL_SOLUTION | Freq: Every evening | ORAL | Status: DC | PRN

## 2014-02-06 MED ORDER — AMOXICILLIN 500 MG PO CAPS: 500.0000 mg | ORAL_CAPSULE | Freq: Two times a day (BID) | ORAL | Status: DC

## 2014-02-06 NOTE — Patient Instructions (Addendum)
Consider flonase and quafenasin.   Urinary Incontinence Urinary incontinence is the involuntary loss of urine from your bladder. CAUSES  There are many causes of urinary incontinence. They include:  Medicines.  Infections.  Prostatic enlargement, leading to overflow of urine from your bladder.  Surgery.  Neurological diseases.  Emotional factors. SIGNS AND SYMPTOMS Urinary Incontinence can be divided into four types: 1. Urge incontinence. Urge incontinence is the involuntary loss of urine before you have the opportunity to go to the bathroom. There is a sudden urge to void but not enough time to reach a bathroom. 2. Stress incontinence. Stress incontinence is the sudden loss of urine with any activity that forces urine to pass. It is commonly caused by anatomical changes to the pelvis and sphincter areas of your body. 3. Overflow incontinence. Overflow incontinence is the loss of urine from an obstructed opening to your bladder. This results in a backup of urine and a resultant buildup of pressure within the bladder. When the pressure within the bladder exceeds the closing pressure of the sphincter, the urine overflows, which causes incontinence, similar to water overflowing a dam. 4. Total incontinence. Total incontinence is the loss of urine as a result of the inability to store urine within your bladder. DIAGNOSIS  Evaluating the cause of incontinence may require:  A thorough and complete medical and obstetric history.  A complete physical exam.  Laboratory tests such as a urine culture and sensitivities. When additional tests are indicated, they can include:  An ultrasound exam.  Kidney and bladder X-rays.  Cystoscopy. This is an exam of the bladder using a narrow scope.  Urodynamic testing to test the nerve function to the bladder and sphincter areas. TREATMENT  Treatment for urinary incontinence depends on the cause:  For urge incontinence caused by a bacterial  infection, antibiotics will be prescribed. If the urge incontinence is related to medicines you take, your health care provider may have you change the medicine.  For stress incontinence, surgery to re-establish anatomical support to the bladder or sphincter, or both, will often correct the condition.  For overflow incontinence caused by an enlarged prostate, an operation to open the channel through the enlarged prostate will allow the flow of urine out of the bladder. In women with fibroids, a hysterectomy may be recommended.  For total incontinence, surgery on your urinary sphincter may help. An artificial urinary sphincter (an inflatable cuff placed around the urethra) may be required. In women who have developed a hole-like passage between their bladder and vagina (vesicovaginal fistula), surgery to close the fistula often is required. HOME CARE INSTRUCTIONS  Normal daily hygiene and the use of pads or adult diapers that are changed regularly will help prevent odors and skin damage.  Avoid caffeine. It can overstimulate your bladder.  Use the bathroom regularly. Try about every 2-3 hours to go to the bathroom, even if you do not feel the need to do so. Take time to empty your bladder completely. After urinating, wait a minute. Then try to urinate again.  For causes involving nerve dysfunction, keep a log of the medicines you take and a journal of the times you go to the bathroom. SEEK MEDICAL CARE IF:  You experience worsening of pain instead of improvement in pain after your procedure.  Your incontinence becomes worse instead of better. SEE IMMEDIATE MEDICAL CARE IF:  You experience fever or shaking chills.  You are unable to pass your urine.  You have redness spreading into your groin or  down into your thighs. MAKE SURE YOU:   Understand these instructions.   Will watch your condition.  Will get help right away if you are not doing well or get worse. Document Released:  04/20/2004 Document Revised: 01/01/2013 Document Reviewed: 08/20/2012 [facility] Patient Information 2015 [place], [place]. This information is not intended to replace advice given to you by your health care provider. Make sure you discuss any questions you have with your health care provider.   Kegel Exercises The goal of Kegel exercises is to isolate and exercise your pelvic floor muscles. These muscles act as a hammock that supports the rectum, vagina, small intestine, and uterus. As the muscles weaken, the hammock sags and these organs are displaced from their normal positions. Kegel exercises can strengthen your pelvic floor muscles and help you to improve bladder and bowel control, improve sexual response, and help reduce many problems and some discomfort during pregnancy. Kegel exercises can be done anywhere and at any time. HOW TO PERFORM [place] your pelvic floor muscles. To do this, squeeze (contract) the muscles that you use when you try to stop the flow of urine. You will feel a tightness in the vaginal area (women) and a tight lift in the rectal area (men and women).  When you begin, contract your pelvic muscles tight for 2-5 seconds, then relax them for 2-5 seconds. This is one set. Do 4-5 sets with a short pause in between.  Contract your pelvic muscles for 8-10 seconds, then relax them for 8-10 seconds. Do 4-5 sets. If you cannot contract your pelvic muscles for 8-10 seconds, try 5-7 seconds and work your way up to 8-10 seconds. Your goal is 4-5 sets of 10 contractions each day. Keep your stomach, buttocks, and legs relaxed during the exercises. Perform sets of both short and long contractions. Vary your positions. Perform these contractions 3-4 times per day. Perform sets while you are:  5. Lying in bed in the morning. 6. Standing at lunch. 7. Sitting in the late afternoon. 8. Lying in bed at night. You should do 40-50 contractions per day. Do not perform more Kegel  exercises per day than recommended. Overexercising can cause muscle fatigue. Continue these exercises for for at least 15-20 weeks or as directed by your caregiver. Document Released: 02/28/2012 Document Reviewed: 02/28/2012 [facility] Patient Information 2015 [place]. This information is not intended to replace advice given to you by your health care provider. Make sure you discuss any questions you have with your health care provider.

## 2014-02-09 ENCOUNTER — Ambulatory Visit (INDEPENDENT_AMBULATORY_CARE_PROVIDER_SITE_OTHER): Payer: Managed Care, Other (non HMO) | Admitting: Sports Medicine

## 2014-02-09 ENCOUNTER — Encounter: Payer: Self-pay | Admitting: Sports Medicine

## 2014-02-09 VITALS — BP 117/71 | HR 81 | Ht 67.0 in | Wt 223.0 lb

## 2014-02-09 DIAGNOSIS — H109 Unspecified conjunctivitis: Secondary | ICD-10-CM | POA: Insufficient documentation

## 2014-02-09 MED ORDER — FLUTICASONE PROPIONATE 50 MCG/ACT NA SUSP: NASAL | Status: DC

## 2014-02-09 MED ORDER — ERYTHROMYCIN 5 MG/GM OP OINT: 1.0000 "application " | TOPICAL_OINTMENT | Freq: Three times a day (TID) | OPHTHALMIC | Status: AC

## 2014-02-09 MED ORDER — BENZONATATE 200 MG PO CAPS: 200.0000 mg | ORAL_CAPSULE | Freq: Three times a day (TID) | ORAL | Status: DC | PRN

## 2014-02-09 NOTE — Assessment & Plan Note (Signed)
Tessalon Perles, topical erythromycin, Flonase. Out of work today. Return if no better in one week.

## 2014-02-09 NOTE — Progress Notes (Signed)
Subjective:    Patient ID: Lauren Bowers, female    DOB: [birth year], 38 y.o.   MRN: [id number]  HPI  Pt is a 38 yo woman who presents to the clinic to establish care.   .. Active Ambulatory Problems    Diagnosis Date Noted  . Depression 02/06/2014  . Anxiety disorder 02/06/2014  . Family planning counseling 02/06/2014  . Conjunctivitis 02/09/2014   Resolved Ambulatory Problems    Diagnosis Date Noted  . No Resolved Ambulatory Problems   Past Medical History  Diagnosis Date  . Allergy   . Anxiety    .Marland Kitchen Family History  Problem Relation Age of Onset  . Heart disease Father 36    AMI/stenting  . Hypertension Father   . Hyperlipidemia Father   . Multiple sclerosis Father   . Ovarian cancer Maternal Grandmother   . Heart disease Maternal Grandfather   . Cancer Mother    .Marland Kitchen History   Social History  . Marital Status: Married    Spouse Name: N/A    Number of Children: N/A  . Years of Education: N/A   Occupational History  . Not on file.   Social History Main Topics  . Smoking status: Current Every Day Smoker -- 0.50 packs/day for 20 years    Types: Cigarettes    Start date: 10/23/1992  . Smokeless tobacco: Not on file  . Alcohol Use: Yes  . Drug Use: No  . Sexual Activity: Yes    Birth Control/ Protection: Pill   Other Topics Concern  . Not on file   Social History Narrative   Marital status: married      Child: one      Lives: with husband, child.      Employment:  Call center for [employer].         Multiple concerns today.   She does have some on and off depression and anxiety. Does not control life. No sucidal or homicidal thoughts. Still finds pleasure in life. Hx of SSRI treatment for a few years. Started exercising and has helped. She has a lot going on with kids and husband as well as job right now. She also still facing loss of 2 children at [redacted] weeks pregnant.   She does have leakage with sneezing, coughing, or laughing. Denies any leakage  without stress. No dysuria or discharge. Been going on for last year or more.   She would like to come birth control. Wants to discuss options.   Not felt well for last 4 days. In laws were sick and have been visiting. Cough is slightly productive. No sinus pressure. No fever, chills, n/v/d. Feels very congested. No ear pain. On nyquil and dayquil with little relief. No wheezing or SOB.     Review of Systems  All other systems reviewed and are negative.      Objective:   Physical Exam  Constitutional: She is oriented to person, place, and time. She appears well-developed and well-nourished.  HENT:  Head: Normocephalic and atraumatic.  Right Ear: External ear normal.  Left Ear: External ear normal.  Mouth/Throat: Oropharynx is clear and moist. No oropharyngeal exudate.  TM's clear bilaterally.   Bilateral nasal turbinates red and swollen.   Negative for maxillary or frontal sinus tenderness.   Eyes: Conjunctivae are normal. Right eye exhibits no discharge. Left eye exhibits no discharge.  Neck: Normal range of motion. Neck supple.  Cardiovascular: Normal rate, regular rhythm and normal heart sounds.   Pulmonary/Chest:  Effort normal and breath sounds normal. She has no wheezes.  Lymphadenopathy:    She has no cervical adenopathy.  Neurological: She is alert and oriented to person, place, and time.  Skin: Skin is dry.  Psychiatric: She has a normal mood and affect. Her behavior is normal.          Assessment & Plan:  Family planning- currently tolerating pill well. Stay on pill right now. Discussed BTL is a great procedure but if has hx of painful heavy periods she might still need to be on OCP for control and if that is case procedure might not be as beneficia if using only to stop taking OCPl. Discussed we could make referral to GYN for discussion if she would like to have. She could also make appt herself.   Anxiety/depression- GAD-7 was 6 and PHQ-9 was 3. Pt does not want  medication. Discussed diet, exercise and counseling. Follow up as needed.   Stress incontinence- discussed medication. At this point kegals would be the best first line treatment. Does not seem to be leaking except for under stress and not urge.   URI- discussed not signs of bacterial infection at this point. Seems viral. Since weekend did give amoxil to start if not improving or worsening. Discussed mucinex. Hycodan given for cough syrup at bedtime. flonase to be considered.   Mammogram was ordered.

## 2014-02-09 NOTE — Progress Notes (Signed)
  Subjective:    CC: still sick  HPI: This is a pleasant 38 year old female, for the past 6 days she's had increasing cough, runny nose, sore throat, and more recently has developed reddening of the left eye without visual changes. There is significant mucus buildup in the eyes are stuck shut in the morning. Symptoms are moderate, persistent, no GI symptoms, no skin rashes. Multiple sick contacts at home.  Past medical history, Surgical history, Family history not pertinant except as noted below, Social history, Allergies, and medications have been entered into the medical record, reviewed, and no changes needed.   Review of Systems: No fevers, chills, night sweats, weight loss, chest pain, or shortness of breath.   Objective:    General: Well Developed, well nourished, and in no acute distress.  Neuro: Alert and oriented x3, extra-ocular muscles intact, sensation grossly intact.  HEENT: Normocephalic, atraumatic, pupils equal round reactive to light, neck supple, no masses, no lymphadenopathy, thyroid nonpalpable. Oropharynx shows an erythematous enlarged tonsils without exudates, nasopharynx and external ear canals are unremarkable.  Left conjunctiva is mildly injected with watery discharge. Skin: Warm and dry, no rashes. Cardiac: Regular rate and rhythm, no murmurs rubs or gallops, no lower extremity edema.  Respiratory: Clear to auscultation bilaterally. Not using accessory muscles, speaking in full sentences.  Impression and Recommendations:

## 2014-03-05 ENCOUNTER — Inpatient Hospital Stay (HOSPITAL_COMMUNITY)
Admission: AD | Admit: 2014-03-05 | Discharge: 2014-03-05 | Disposition: A | Discharge status: Home / Self Care | Payer: Managed Care, Other (non HMO) | Source: Ambulatory Visit | Attending: Obstetrics and Gynecology | Admitting: Obstetrics and Gynecology

## 2014-03-05 ENCOUNTER — Inpatient Hospital Stay (HOSPITAL_COMMUNITY): Payer: Managed Care, Other (non HMO)

## 2014-03-05 ENCOUNTER — Encounter (HOSPITAL_COMMUNITY): Payer: Self-pay | Admitting: *Deleted

## 2014-03-05 DIAGNOSIS — O99331 Smoking (tobacco) complicating pregnancy, first trimester: Secondary | ICD-10-CM | POA: Insufficient documentation

## 2014-03-05 DIAGNOSIS — O039 Complete or unspecified spontaneous abortion without complication: Secondary | ICD-10-CM | POA: Insufficient documentation

## 2014-03-05 DIAGNOSIS — O209 Hemorrhage in early pregnancy, unspecified: Secondary | ICD-10-CM | POA: Diagnosis present

## 2014-03-05 DIAGNOSIS — F17210 Nicotine dependence, cigarettes, uncomplicated: Secondary | ICD-10-CM | POA: Insufficient documentation

## 2014-03-05 DIAGNOSIS — Z3A09 9 weeks gestation of pregnancy: Secondary | ICD-10-CM | POA: Diagnosis not present

## 2014-03-05 DIAGNOSIS — O034 Incomplete spontaneous abortion without complication: Secondary | ICD-10-CM

## 2014-03-05 LAB — CBC
HEMATOCRIT: 36.7 % (ref 36.0–46.0)
HEMOGLOBIN: 12.7 g/dL (ref 12.0–15.0)
MCH: 31.4 pg (ref 26.0–34.0)
MCHC: 34.6 g/dL (ref 30.0–36.0)
MCV: 90.6 fL (ref 78.0–100.0)
Platelets: 366 10*3/uL (ref 150–400)
RBC: 4.05 MIL/uL (ref 3.87–5.11)
RDW: 12.7 % (ref 11.5–15.5)
WBC: 19.6 10*3/uL — ABNORMAL HIGH (ref 4.0–10.5)

## 2014-03-05 LAB — ABO/RH: ABO/RH(D): O POS

## 2014-03-05 MED ORDER — DEXTROSE 5 % IN LACTATED RINGERS IV BOLUS
500.0000 mL | Freq: Once | INTRAVENOUS | Status: AC
  Administered 2014-03-05: 500 mL via INTRAVENOUS

## 2014-03-05 MED ORDER — DEXTROSE IN LACTATED RINGERS 5 % IV SOLN
INTRAVENOUS | Status: DC
  Administered 2014-03-05: 14:00:00 via INTRAVENOUS

## 2014-03-05 NOTE — Discharge Instructions (Signed)
Miscarriage A miscarriage is the sudden loss of an unborn baby (fetus) before the 20th week of pregnancy. Most miscarriages happen in the first 3 months of pregnancy. Sometimes, it happens before a woman even knows she is pregnant. A miscarriage is also called a "spontaneous miscarriage" or "early pregnancy loss." Having a miscarriage can be an emotional experience. Talk with your caregiver about any questions you may have about miscarrying, the grieving process, and your future pregnancy plans. CAUSES   Problems with the fetal chromosomes that make it impossible for the baby to develop normally. Problems with the baby's genes or chromosomes are most often the result of errors that occur, by chance, as the embryo divides and grows. The problems are not inherited from the parents.  Infection of the cervix or uterus.   Hormone problems.   Problems with the cervix, such as having an incompetent cervix. This is when the tissue in the cervix is not strong enough to hold the pregnancy.   Problems with the uterus, such as an abnormally shaped uterus, uterine fibroids, or congenital abnormalities.   Certain medical conditions.   Smoking, drinking alcohol, or taking illegal drugs.   Trauma.  Often, the cause of a miscarriage is unknown.  SYMPTOMS   Vaginal bleeding or spotting, with or without cramps or pain.  Pain or cramping in the abdomen or lower back.  Passing fluid, tissue, or blood clots from the vagina. DIAGNOSIS  Your caregiver will perform a physical exam. You may also have an ultrasound to confirm the miscarriage. Blood or urine tests may also be ordered. TREATMENT   Sometimes, treatment is not necessary if you naturally pass all the fetal tissue that was in the uterus. If some of the fetus or placenta remains in the body (incomplete miscarriage), tissue left behind may become infected and must be removed. Usually, a dilation and curettage (D and C) procedure is performed.  During a D and C procedure, the cervix is widened (dilated) and any remaining fetal or placental tissue is gently removed from the uterus.  Antibiotic medicines are prescribed if there is an infection. Other medicines may be given to reduce the size of the uterus (contract) if there is a lot of bleeding.  If you have Rh negative blood and your baby was Rh positive, you will need a Rh immunoglobulin shot. This shot will protect any future baby from having Rh blood problems in future pregnancies. HOME CARE INSTRUCTIONS   Your caregiver may order bed rest or may allow you to continue light activity. Resume activity as directed by your caregiver.  Have someone help with home and family responsibilities during this time.   Keep track of the number of sanitary pads you use each day and how soaked (saturated) they are. Write down this information.   Do not use tampons. Do not douche or have sexual intercourse until approved by your caregiver.   Only take over-the-counter or prescription medicines for pain or discomfort as directed by your caregiver.   Do not take aspirin. Aspirin can cause bleeding.   Keep all follow-up appointments with your caregiver.   If you or your partner have problems with grieving, talk to your caregiver or seek counseling to help cope with the pregnancy loss. Allow enough time to grieve before trying to get pregnant again.  SEEK IMMEDIATE MEDICAL CARE IF:   You have severe cramps or pain in your back or abdomen.  You have a fever.  You pass large blood clots (walnut-sized   or larger) ortissue from your vagina. Save any tissue for your caregiver to inspect.   Your bleeding increases.   You have a thick, bad-smelling vaginal discharge.  You become lightheaded, weak, or you faint.   You have chills.  MAKE SURE YOU:  Understand these instructions.  Will watch your condition.  Will get help right away if you are not doing well or get  worse. Document Released: 09/06/2000 Document Revised: 07/08/2012 Document Reviewed: 05/02/2011 [employer] Patient Information 2015 [employer]. This information is not intended to replace advice given to you by your health care provider. Make sure you discuss any questions you have with your health care provider.  

## 2014-03-05 NOTE — MAU Note (Signed)
Pt was seen at [facility] by Dr. Garwin Brothers, sent to MAU due to an incomplete miscarriage for further evaluation.

## 2014-03-05 NOTE — Progress Notes (Signed)
   03/05/14 1300  Clinical Encounter Type  Visited With Patient and family together (husband Matt)  Visit Type Spiritual support;Social support Architectural technologist support)  Referral From Nurse  Spiritual Encounters  Spiritual Needs Grief support;Emotional (prayer shawl as tangible sign of comfort and support)  Stress Factors  Patient Stress Factors Loss (2 previous stillbirths at 20w (in [place]; less support here))  Family Stress Factors Loss (2 previous stillbirths at 19w (in [place]; less support here))   Met with Lauren Bowers and husband Lauren Bowers twice today to offer bereavement support as they face miscarriage after a hx of two stillbirths.  They are from [place] and have lived in [place] for three years, where they are away from their primary family and support system.  Family has been very Patent attorney of pastoral presence, reflective listening, affirmation/normalization of feelings, and prayer shawl to convey comfort and support.  Will continue to follow closely, but please also page as needs arise:  [phone].  Thank you.  [place], [place]

## 2014-03-05 NOTE — MAU Provider Note (Signed)
History     CSN: [id number]  Arrival date and time: 03/05/14 1114  First Provider Initiated Contact with Patient 03/05/14 1403      Chief Complaint  Patient presents with  . Vaginal Bleeding  . Miscarriage   HPI Comments: [id number] @[redacted]w[redacted]d  by LMP here for heavy vaginal bleeding since 0600 this am. Reports spotting x4-5 days then became red and heavy this am. Sono 1 wk ago showed irregular IUGS, no FP, and s/d discrepancy behind by 2 wks. No dizziness or SOB. Tolerating po w/o problem. Hx of 20 wk loss x2, this pregnancy conceived on OCPs.  Vaginal Bleeding    OB History    Gravida Para Term Preterm AB TAB SAB Ectopic Multiple Living   4 3 1 2      1       Past Medical History  Diagnosis Date  . Allergy   . Anxiety   . Depression     Past Surgical History  Procedure Laterality Date  . Cholecystectomy      Family History  Problem Relation Age of Onset  . Heart disease Father 68    AMI/stenting  . Hypertension Father   . Hyperlipidemia Father   . Multiple sclerosis Father   . Ovarian cancer Maternal Grandmother   . Heart disease Maternal Grandfather   . Cancer Mother     History  Substance Use Topics  . Smoking status: Current Every Day Smoker -- 0.50 packs/day for 20 years    Types: Cigarettes    Start date: 10/23/1992  . Smokeless tobacco: Not on file  . Alcohol Use: Yes    Allergies:  Allergies  Allergen Reactions  . Bactrim [Sulfamethoxazole-Trimethoprim] Hives    Prescriptions prior to admission  Medication Sig Dispense Refill Last Dose  . norethindrone (MICRONOR,CAMILA,ERRIN) 0.35 MG tablet Take 1 tablet (0.35 mg total) by mouth daily. 84 tablet 0 03/04/2014 at Unknown time  . amoxicillin (AMOXIL) 500 MG capsule Take 1 capsule (500 mg total) by mouth 2 (two) times daily. (Patient not taking: Reported on 03/05/2014) 20 capsule 0 Taking  . benzonatate (TESSALON) 200 MG capsule Take 1 capsule (200 mg total) by mouth 3 (three) times daily as needed for  cough. (Patient not taking: Reported on 03/05/2014) 45 capsule 0   . fluticasone (FLONASE) 50 MCG/ACT nasal spray One spray in each nostril twice a day, use left hand for right nostril, and right hand for left nostril. (Patient not taking: Reported on 03/05/2014) 48 g 3   . HYDROcodone-homatropine (HYCODAN) 5-1.5 MG/5ML syrup Take 5 mLs by mouth at bedtime as needed. (Patient not taking: Reported on 03/05/2014) 120 mL 0 Taking    Review of Systems  Constitutional: Negative.   HENT: Negative.   Eyes: Negative.   Respiratory: Negative.   Cardiovascular: Negative.   Gastrointestinal: Negative.   Genitourinary: Negative.   Musculoskeletal: Negative.   Skin: Negative.   Neurological: Negative.   Endo/Heme/Allergies: Negative.   Psychiatric/Behavioral: Negative.    Physical Exam   Blood pressure 106/74, pulse 119, resp. rate 16, last menstrual period 12/30/2013.  Physical Exam  Constitutional: She is oriented to person, place, and time. She appears well-developed and well-nourished.  HENT:  Head: Normocephalic.  Eyes: Pupils are equal, round, and reactive to light.  Neck: Normal range of motion. Neck supple.  Cardiovascular: Normal rate.   Respiratory: Effort normal.  GI: Soft. She exhibits no distension. There is no tenderness. There is no rebound.  Genitourinary: Vagina normal.  Small brb on peripad  Musculoskeletal: Normal range of motion.  Neurological: She is alert and oriented to person, place, and time.  Skin: Skin is warm and dry.  Psychiatric: She has a normal mood and affect.  Sono: no IUGS, YS, FP, or FF; heterogeneous thickened endometrium, rt adnexa normal, left adnexa not visualized, small fibroid 1x1x1cm  MAU Course  Procedures CBC Sono  MDM n/a  Assessment and Plan  Spontaneous abortion Rh positive  Discharge home Bleeding precautions Pelvic rest Motrin OTC prn pain Follow-up in 1 wk with Dr. Jearld Fenton, Samanta Gal, N 03/05/2014, 2:20 PM

## 2014-05-02 ENCOUNTER — Encounter: Payer: Self-pay | Admitting: *Deleted

## 2014-05-02 ENCOUNTER — Emergency Department (INDEPENDENT_AMBULATORY_CARE_PROVIDER_SITE_OTHER)
Admission: EM | Admit: 2014-05-02 | Discharge: 2014-05-02 | Disposition: A | Discharge status: Home / Self Care | Payer: BLUE CROSS/BLUE SHIELD | Source: Home / Self Care | Attending: Family Medicine | Admitting: Family Medicine

## 2014-05-02 DIAGNOSIS — N3001 Acute cystitis with hematuria: Secondary | ICD-10-CM

## 2014-05-02 LAB — POCT URINALYSIS DIP (MANUAL ENTRY)
BILIRUBIN UA: NEGATIVE
Bilirubin, UA: NEGATIVE
Glucose, UA: NEGATIVE
Nitrite, UA: NEGATIVE
Protein Ur, POC: 30
SPEC GRAV UA: 1.025
Urobilinogen, UA: 0.2
pH, UA: 6

## 2014-05-02 MED ORDER — CEPHALEXIN 500 MG PO CAPS: 500.0000 mg | ORAL_CAPSULE | Freq: Two times a day (BID) | ORAL | Status: DC

## 2014-05-02 NOTE — ED Notes (Signed)
Pt complains of burning urination for 5 days.  She drank a lot of cranberry juice and it got better temporarily.  When it started she experienced frequency and urgency but not anymore.  She did notice a dark discharge on a pantiliner this morning also.  Denies new sexual partners.

## 2014-05-02 NOTE — ED Provider Notes (Signed)
Lauren Bowers is a 39 y.o. female who presents to Urgent Care today for urinary frequency and urgency and dysuria present for 4-5 days. Patient notes a bit of blood in the urine. Minimal vaginal discharge. No fevers or chills nausea vomiting or diarrhea. Patient had cranberry juice which helps.   Past Medical History  Diagnosis Date  . Allergy   . Anxiety   . Depression    Past Surgical History  Procedure Laterality Date  . Cholecystectomy     History  Substance Use Topics  . Smoking status: Current Every Day Smoker -- 0.50 packs/day for 20 years    Types: Cigarettes    Start date: 10/23/1992  . Smokeless tobacco: Not on file  . Alcohol Use: Yes   ROS as above Medications: No current facility-administered medications for this encounter.   Current Outpatient Prescriptions  Medication Sig Dispense Refill  . cephALEXin (KEFLEX) 500 MG capsule Take 1 capsule (500 mg total) by mouth 2 (two) times daily. 14 capsule 0  . [DISCONTINUED] fluticasone (FLONASE) 50 MCG/ACT nasal spray One spray in each nostril twice a day, use left hand for right nostril, and right hand for left nostril. (Patient not taking: Reported on 03/05/2014) 48 g 3   Allergies  Allergen Reactions  . Bactrim [Sulfamethoxazole-Trimethoprim] Hives     Exam:  BP 127/88 mmHg  Pulse 95  Temp(Src) 98 F (36.7 C) (Oral)  Ht 5\' 6"  (1.676 m)  Wt 198 lb (89.812 kg)  BMI 31.97 kg/m2  SpO2 100%  LMP 04/16/2014  Breastfeeding? Unknown Gen: Well NAD HEENT: EOMI,  MMM Lungs: Normal work of breathing. CTABL Heart: RRR no MRG Abd: NABS, Soft. Nondistended, Nontender no CVA tenderness to percussion Exts: Brisk capillary refill, warm and well perfused.   Results for orders placed or performed during the hospital encounter of 05/02/14 (from the past 24 hour(s))  POCT urinalysis dipstick (new)     Status: None   Collection Time: 05/02/14  1:46 PM  Result Value Ref Range   Color, UA yellow    Clarity, UA cloudy    Glucose, UA neg    Bilirubin, UA negative    Bilirubin, UA negative    Spec Grav, UA 1.025    Blood, UA moderate    pH, UA 6.0    Protein Ur, POC =30    Urobilinogen, UA 0.2    Nitrite, UA Negative    Leukocytes, UA moderate (2+)    No results found.  Assessment and Plan: 39 y.o. female with UTI. Culture pending. Treat with Keflex. Return as needed.  Discussed warning signs or symptoms. Please see discharge instructions. Patient expresses understanding.     Gregor Hams, MD 05/02/14 [phone]

## 2014-05-02 NOTE — Discharge Instructions (Signed)
Thank you for coming in today. Take Keflex twice daily for one week. Return as needed. Follow-up with OB/GYN as needed   Urinary Tract Infection Urinary tract infections (UTIs) can develop anywhere along your urinary tract. Your urinary tract is your body's drainage system for removing wastes and extra water. Your urinary tract includes two kidneys, two ureters, a bladder, and a urethra. Your kidneys are a pair of bean-shaped organs. Each kidney is about the size of your fist. They are located below your ribs, one on each side of your spine. CAUSES Infections are caused by microbes, which are microscopic organisms, including fungi, viruses, and bacteria. These organisms are so small that they can only be seen through a microscope. Bacteria are the microbes that most commonly cause UTIs. SYMPTOMS  Symptoms of UTIs may vary by age and gender of the patient and by the location of the infection. Symptoms in young women typically include a frequent and intense urge to urinate and a painful, burning feeling in the bladder or urethra during urination. Older women and men are more likely to be tired, shaky, and weak and have muscle aches and abdominal pain. A fever may mean the infection is in your kidneys. Other symptoms of a kidney infection include pain in your back or sides below the ribs, nausea, and vomiting. DIAGNOSIS To diagnose a UTI, your caregiver will ask you about your symptoms. Your caregiver also will ask to provide a urine sample. The urine sample will be tested for bacteria and white blood cells. White blood cells are made by your body to help fight infection. TREATMENT  Typically, UTIs can be treated with medication. Because most UTIs are caused by a bacterial infection, they usually can be treated with the use of antibiotics. The choice of antibiotic and length of treatment depend on your symptoms and the type of bacteria causing your infection. HOME CARE INSTRUCTIONS  If you were  prescribed antibiotics, take them exactly as your caregiver instructs you. Finish the medication even if you feel better after you have only taken some of the medication.  Drink enough water and fluids to keep your urine clear or pale yellow.  Avoid caffeine, tea, and carbonated beverages. They tend to irritate your bladder.  Empty your bladder often. Avoid holding urine for long periods of time.  Empty your bladder before and after sexual intercourse.  After a bowel movement, women should cleanse from front to back. Use each tissue only once. SEEK MEDICAL CARE IF:   You have back pain.  You develop a fever.  Your symptoms do not begin to resolve within 3 days. SEEK IMMEDIATE MEDICAL CARE IF:   You have severe back pain or lower abdominal pain.  You develop chills.  You have nausea or vomiting.  You have continued burning or discomfort with urination. MAKE SURE YOU:   Understand these instructions.  Will watch your condition.  Will get help right away if you are not doing well or get worse. Document Released: 12/21/2004 Document Revised: 09/12/2011 Document Reviewed: 04/21/2011 [facility] Patient Information 2015 [place], [place]. This information is not intended to replace advice given to you by your health care provider. Make sure you discuss any questions you have with your health care provider.

## 2014-05-04 ENCOUNTER — Telehealth: Payer: Self-pay | Admitting: Emergency Medicine

## 2014-05-05 LAB — URINE CULTURE: Colony Count: >=100000

## 2016-01-21 ENCOUNTER — Other Ambulatory Visit: Payer: Self-pay | Admitting: Endocrinology

## 2016-01-21 ENCOUNTER — Other Ambulatory Visit (HOSPITAL_COMMUNITY): Payer: Self-pay | Admitting: Endocrinology

## 2016-01-21 DIAGNOSIS — E0590 Thyrotoxicosis, unspecified without thyrotoxic crisis or storm: Secondary | ICD-10-CM

## 2016-02-14 ENCOUNTER — Ambulatory Visit (HOSPITAL_COMMUNITY): Payer: BLUE CROSS/BLUE SHIELD

## 2016-02-15 ENCOUNTER — Other Ambulatory Visit (HOSPITAL_COMMUNITY): Payer: BLUE CROSS/BLUE SHIELD

## 2017-04-13 DIAGNOSIS — Z1151 Encounter for screening for human papillomavirus (HPV): Secondary | ICD-10-CM | POA: Diagnosis not present

## 2017-04-13 DIAGNOSIS — Z6831 Body mass index (BMI) 31.0-31.9, adult: Secondary | ICD-10-CM | POA: Diagnosis not present

## 2017-04-13 DIAGNOSIS — Z01419 Encounter for gynecological examination (general) (routine) without abnormal findings: Secondary | ICD-10-CM | POA: Diagnosis not present

## 2017-04-13 DIAGNOSIS — Z1231 Encounter for screening mammogram for malignant neoplasm of breast: Secondary | ICD-10-CM | POA: Diagnosis not present

## 2017-07-30 DIAGNOSIS — Z3202 Encounter for pregnancy test, result negative: Secondary | ICD-10-CM | POA: Diagnosis not present

## 2017-08-21 DIAGNOSIS — R232 Flushing: Secondary | ICD-10-CM | POA: Diagnosis not present

## 2017-12-12 ENCOUNTER — Emergency Department (INDEPENDENT_AMBULATORY_CARE_PROVIDER_SITE_OTHER)
Admission: EM | Admit: 2017-12-12 | Discharge: 2017-12-12 | Disposition: A | Discharge status: Home / Self Care | Payer: BLUE CROSS/BLUE SHIELD | Source: Home / Self Care | Attending: Family Medicine | Admitting: Family Medicine

## 2017-12-12 ENCOUNTER — Other Ambulatory Visit: Payer: Self-pay

## 2017-12-12 DIAGNOSIS — J069 Acute upper respiratory infection, unspecified: Secondary | ICD-10-CM

## 2017-12-12 DIAGNOSIS — B9789 Other viral agents as the cause of diseases classified elsewhere: Secondary | ICD-10-CM

## 2017-12-12 MED ORDER — AZITHROMYCIN 250 MG PO TABS: ORAL_TABLET | ORAL | 0 refills | Status: DC

## 2017-12-12 MED ORDER — BENZONATATE 200 MG PO CAPS: ORAL_CAPSULE | ORAL | 0 refills | Status: DC

## 2017-12-12 NOTE — Discharge Instructions (Addendum)
Take plain guaifenesin (1200mg  extended release tabs such as Mucinex) twice daily, with plenty of water, for cough and congestion.  May add Pseudoephedrine (30mg , one or two every 4 to 6 hours) for sinus congestion.  Get adequate rest.   May use Afrin nasal spray (or generic oxymetazoline) each morning for about 5 days and then discontinue.  Also recommend using saline nasal spray several times daily and saline nasal irrigation (AYR is a common brand).  Use Flonase nasal spray each morning after using Afrin nasal spray and saline nasal irrigation. Try warm salt water gargles for sore throat.  Stop all antihistamines for now, and other non-prescription cough/cold preparations. May take Delsym Cough Suppressant with Tessalon at bedtime for nighttime cough.  May take Ibuprofen 200mg , 4 tabs every 8 hours with food for headache, sore throat, etc. Begin Azithromycin if not improving about one week or if persistent fever develops

## 2017-12-12 NOTE — ED Triage Notes (Signed)
Cough, congestion, sneezing x 4 days

## 2017-12-12 NOTE — ED Provider Notes (Signed)
[facility]    CSN: [id number] Arrival date & time: 12/12/17  1934     History   Chief Complaint Chief Complaint  Patient presents with  . Cough    HPI Lauren Bowers is a 42 y.o. female.   Patient complains of three day history of typical cold-like symptoms developing over several days, including mild sore throat, sinus congestion, fatigue, and cough.  No fevers, chills, and sweats.  No pleuritic pain although she has had mild shortness of breath at times.  The history is provided by the patient.    Past Medical History:  Diagnosis Date  . Allergy   . Anxiety   . Depression     Patient Active Problem List   Diagnosis Date Noted  . Conjunctivitis 02/09/2014  . Depression 02/06/2014  . Anxiety disorder 02/06/2014  . Family planning counseling 02/06/2014    Past Surgical History:  Procedure Laterality Date  . CHOLECYSTECTOMY      OB History    Gravida  4   Para  3   Term  1   Preterm  2   AB      Living  1     SAB      TAB      Ectopic      Multiple      Live Births  3            Home Medications    Prior to Admission medications   Medication Sig Start Date End Date Taking? Authorizing Provider  azithromycin (ZITHROMAX Z-PAK) 250 MG tablet Take 2 tabs today; then begin one tab once daily for 4 more days. (Rx void after 12/20/17)) 12/12/17   Kandra Nicolas, MD  benzonatate (TESSALON) 200 MG capsule Take one cap by mouth at bedtime as needed for cough.  May repeat in 4 to 6 hours 12/12/17   Kandra Nicolas, MD  fluticasone [facility]) 50 MCG/ACT nasal spray One spray in each nostril twice a day, use left hand for right nostril, and right hand for left nostril. Patient not taking: Reported on 03/05/2014 02/09/14 05/02/14  Silverio Decamp, MD    Family History Family History  Problem Relation Age of Onset  . Heart disease Father 29       AMI/stenting  . Hypertension Father   . Hyperlipidemia Father   . Multiple  sclerosis Father   . Ovarian cancer Maternal Grandmother   . Heart disease Maternal Grandfather   . Cancer Mother     Social History Social History   Tobacco Use  . Smoking status: Current Every Day Smoker    Packs/day: 0.50    Years: 20.00    Pack years: 10.00    Types: Cigarettes    Start date: 10/23/1992  Substance Use Topics  . Alcohol use: Yes  . Drug use: No     Allergies   Bactrim [sulfamethoxazole-trimethoprim]   Review of Systems Review of Systems + sore throat + cough No pleuritic pain No wheezing + nasal congestion + post-nasal drainage + sneezing No sinus pain/pressure No itchy/red eyes No earache No hemoptysis ? SOB No fever/chills No nausea No vomiting No abdominal pain No diarrhea No urinary symptoms No skin rash + fatigue No myalgias No headache Used OTC meds without relief   Physical Exam Triage Vital Signs ED Triage Vitals  Enc Vitals Group     BP 12/12/17 1959 116/77     Pulse Rate 12/12/17 1959 88  Resp --      Temp 12/12/17 1959 98.3 F (36.8 C)     Temp Source 12/12/17 1959 Oral     SpO2 12/12/17 1959 97 %     Weight 12/12/17 2000 200 lb (90.7 kg)     Height 12/12/17 2000 5\' 6"  (1.676 m)     Head Circumference --      Peak Flow --      Pain Score 12/12/17 1959 0     Pain Loc --      Pain Edu? --      Excl. in [place]? --    No data found.  Updated Vital Signs BP 116/77 (BP Location: Right Arm)   Pulse 88   Temp 98.3 F (36.8 C) (Oral)   Ht 5\' 6"  (1.676 m)   Wt 90.7 kg   SpO2 97%   BMI 32.28 kg/m   Visual Acuity Right Eye Distance:   Left Eye Distance:   Bilateral Distance:    Right Eye Near:   Left Eye Near:    Bilateral Near:     Physical Exam Nursing notes and Vital Signs reviewed. Appearance:  Patient appears stated age, and in no acute distress Eyes:  Pupils are equal, round, and reactive to light and accomodation.  Extraocular movement is intact.  Conjunctivae are not inflamed  Ears:  Canals  normal.  Tympanic membranes normal.  Nose:  Mildly congested turbinates.  No sinus tenderness.  Pharynx:  Normal Neck:  Supple.  Enlarged posterior/lateral nodes are palpated bilaterally, tender to palpation on the left.   Lungs:  Clear to auscultation.  Breath sounds are equal.  Moving air well. Heart:  Regular rate and rhythm without murmurs, rubs, or gallops.  Abdomen:  Nontender without masses or hepatosplenomegaly.  Bowel sounds are present.  No CVA or flank tenderness.  Extremities:  No edema.  Skin:  No rash present.    UC Treatments / Results  Labs (all labs ordered are listed, but only abnormal results are displayed) Labs Reviewed - No data to display  EKG None  Radiology No results found.  Procedures Procedures (including critical care time)  Medications Ordered in UC Medications - No data to display  Initial Impression / Assessment and Plan / UC Course  I have reviewed the triage vital signs and the nursing notes.  Pertinent labs & imaging results that were available during my care of the patient were reviewed by me and considered in my medical decision making (see chart for details).    There is no evidence of bacterial infection today.  Treat symptomatically for now.  Prescription written for Benzonatate [facility]) to take at bedtime for night-time cough.  Followup with Family Doctor if not improved in about 10 days.   Final Clinical Impressions(s) / UC Diagnoses   Final diagnoses:  Viral URI with cough     Discharge Instructions     Take plain guaifenesin (1200mg  extended release tabs such as Mucinex) twice daily, with plenty of water, for cough and congestion.  May add Pseudoephedrine (30mg , one or two every 4 to 6 hours) for sinus congestion.  Get adequate rest.   May use Afrin nasal spray (or generic oxymetazoline) each morning for about 5 days and then discontinue.  Also recommend using saline nasal spray several times daily and saline nasal irrigation  (AYR is a common brand).  Use Flonase nasal spray each morning after using Afrin nasal spray and saline nasal irrigation. Try warm salt water gargles for  sore throat.  Stop all antihistamines for now, and other non-prescription cough/cold preparations. May take Delsym Cough Suppressant with Tessalon at bedtime for nighttime cough.  May take Ibuprofen 200mg , 4 tabs every 8 hours with food for headache, sore throat, etc. Begin Azithromycin if not improving about one week or if persistent fever develops  (Given a prescription to hold, with an expiration date)      ED Prescriptions    Medication Sig Dispense Auth. Provider   benzonatate (TESSALON) 200 MG capsule Take one cap by mouth at bedtime as needed for cough.  May repeat in 4 to 6 hours 15 capsule Kandra Nicolas, MD   azithromycin (ZITHROMAX Z-PAK) 250 MG tablet Take 2 tabs today; then begin one tab once daily for 4 more days. (Rx void after 12/20/17)) 6 tablet Kandra Nicolas, MD        Kandra Nicolas, MD 12/16/17 [phone]

## 2018-03-04 ENCOUNTER — Telehealth: Payer: Self-pay | Admitting: General Practice

## 2018-03-04 NOTE — Telephone Encounter (Signed)
Will have to wait on Provider approval.

## 2018-03-04 NOTE — Telephone Encounter (Signed)
Patient would like to come in to re-establish care with [place] Planas, PA. States that she needs a physical before the end of the year per insurance since it gives her a discount. Last time patient was seen with [place] was 02/06/14. Please advise.

## 2018-03-06 NOTE — Telephone Encounter (Signed)
Routing to scheduler for completion

## 2018-03-06 NOTE — Telephone Encounter (Signed)
No problem. Go ahead and get her scheduled. Place in 40 minute slot if you can this time.

## 2018-03-06 NOTE — Telephone Encounter (Signed)
Left voicemail for patient to call us for scheduling.

## 2018-03-12 ENCOUNTER — Telehealth: Payer: Self-pay | Admitting: Physician Assistant

## 2018-03-12 ENCOUNTER — Ambulatory Visit (INDEPENDENT_AMBULATORY_CARE_PROVIDER_SITE_OTHER): Payer: BLUE CROSS/BLUE SHIELD | Admitting: Physician Assistant

## 2018-03-12 ENCOUNTER — Encounter: Payer: Self-pay | Admitting: Physician Assistant

## 2018-03-12 VITALS — BP 125/87 | HR 75 | Ht 66.0 in | Wt 213.0 lb

## 2018-03-12 DIAGNOSIS — E6609 Other obesity due to excess calories: Secondary | ICD-10-CM | POA: Diagnosis not present

## 2018-03-12 DIAGNOSIS — G629 Polyneuropathy, unspecified: Secondary | ICD-10-CM

## 2018-03-12 DIAGNOSIS — E28319 Asymptomatic premature menopause: Secondary | ICD-10-CM | POA: Insufficient documentation

## 2018-03-12 DIAGNOSIS — Z131 Encounter for screening for diabetes mellitus: Secondary | ICD-10-CM | POA: Diagnosis not present

## 2018-03-12 DIAGNOSIS — Z716 Tobacco abuse counseling: Secondary | ICD-10-CM | POA: Diagnosis not present

## 2018-03-12 DIAGNOSIS — Z13220 Encounter for screening for lipoid disorders: Secondary | ICD-10-CM | POA: Diagnosis not present

## 2018-03-12 DIAGNOSIS — Z820 Family history of epilepsy and other diseases of the nervous system: Secondary | ICD-10-CM | POA: Insufficient documentation

## 2018-03-12 DIAGNOSIS — Z6834 Body mass index (BMI) 34.0-34.9, adult: Secondary | ICD-10-CM

## 2018-03-12 MED ORDER — BUPROPION HCL ER (SR) 150 MG PO TB12: 150.0000 mg | ORAL_TABLET | Freq: Two times a day (BID) | ORAL | 2 refills | Status: DC

## 2018-03-12 NOTE — Telephone Encounter (Signed)
[facility] and [place] for pap and mammogram.

## 2018-03-12 NOTE — Patient Instructions (Signed)
Will make referral for neurology.  Start wellbutrin.

## 2018-03-12 NOTE — Progress Notes (Signed)
   Subjective:    Patient ID: Lauren Bowers, female    DOB: 09-11-1975, 42 y.o.   MRN: [id number]  HPI  Pt is a 42 yo female who presents to the clinic to re-establish care.   She is in early menopause confirmed by GYN with labs. Per patient pap and mammo done recently. She is having really bad hot flashes. She remains on OCP.   She wants to feel better and get healthy. She is a daily smoker. She smokes 1 pack a day.   She is having numbness and tingling over bilateral arms and legs for a while.  Her father has CMT and she is concerned she could have it. She has never been evaluated.   . Family History  Problem Relation Age of Onset  . Heart disease Father 52       AMI/stenting  . Hypertension Father   . Hyperlipidemia Father   . Multiple sclerosis Father   . Charcot-Marie-Tooth disease Father   . Ovarian cancer Maternal Grandmother   . Heart disease Maternal Grandfather   . Cancer Mother          Review of Systems See HPI.     Objective:   Physical Exam Constitutional:      Appearance: Normal appearance.  HENT:     Head: Normocephalic and atraumatic.  Cardiovascular:     Rate and Rhythm: Normal rate and regular rhythm.  Pulmonary:     Effort: Pulmonary effort is normal.     Breath sounds: Normal breath sounds.  Neurological:     General: No focal deficit present.     Mental Status: She is alert and oriented to person, place, and time.  Psychiatric:        Mood and Affect: Mood normal.        Behavior: Behavior normal.           Assessment & Plan:  [name] was seen today for annual exam.  Diagnoses and all orders for this visit:  Encounter for smoking cessation counseling -     buPROPion (WELLBUTRIN SR) 150 MG 12 hr tablet; Take 1 tablet (150 mg total) by mouth 2 (two) times daily.  Screening for lipid disorders -     Lipid Panel w/reflex Direct LDL  Screening for diabetes mellitus -     COMPLETE METABOLIC PANEL WITH GFR  Class 1 obesity due to  excess calories without serious comorbidity with body mass index (BMI) of 34.0 to 34.9 in adult -     TSH  Early menopause occurring in patient age younger than 45 years  Neuropathy -     Ambulatory referral to Neurology  Family history of Charcot-Marie-Tooth disease -     Ambulatory referral to Neurology   Discussed smoking cessation. Pt agreed to start wellbutrin. Discussed side effects. Discussed smoking cessation and cravings. Follow up in 2 months.   Fasting labs ordered to have drawn downstairs.   Discussed weight loss and staying active.   Discussed natural ways to help with hot flashes. Will call to get pap and mammogram reports.   Will make referral to neurology for work up for CMT due to family hx and numbness and tingling of extremities.

## 2018-03-14 DIAGNOSIS — Z13220 Encounter for screening for lipoid disorders: Secondary | ICD-10-CM | POA: Diagnosis not present

## 2018-03-14 DIAGNOSIS — Z131 Encounter for screening for diabetes mellitus: Secondary | ICD-10-CM | POA: Diagnosis not present

## 2018-03-14 DIAGNOSIS — Z6834 Body mass index (BMI) 34.0-34.9, adult: Secondary | ICD-10-CM | POA: Diagnosis not present

## 2018-03-14 NOTE — Telephone Encounter (Signed)
Requested documentation has been faxed. Thanks!

## 2018-03-15 ENCOUNTER — Other Ambulatory Visit: Payer: Self-pay

## 2018-03-15 ENCOUNTER — Encounter: Payer: Self-pay | Admitting: Physician Assistant

## 2018-03-15 DIAGNOSIS — E0590 Thyrotoxicosis, unspecified without thyrotoxic crisis or storm: Secondary | ICD-10-CM

## 2018-03-15 DIAGNOSIS — R7301 Impaired fasting glucose: Secondary | ICD-10-CM

## 2018-03-15 DIAGNOSIS — R7989 Other specified abnormal findings of blood chemistry: Secondary | ICD-10-CM | POA: Insufficient documentation

## 2018-03-15 LAB — TSH: TSH: 0.4 mIU/L

## 2018-03-15 LAB — COMPLETE METABOLIC PANEL WITH GFR
AG Ratio: 1.7 (calc) (ref 1.0–2.5)
ALKALINE PHOSPHATASE (APISO): 78 U/L (ref 33–115)
ALT: 25 U/L (ref 6–29)
AST: 22 U/L (ref 10–30)
Albumin: 4.4 g/dL (ref 3.6–5.1)
BILIRUBIN TOTAL: 0.3 mg/dL (ref 0.2–1.2)
BUN: 19 mg/dL (ref 7–25)
CO2: 27 mmol/L (ref 20–32)
Calcium: 9.7 mg/dL (ref 8.6–10.2)
Chloride: 105 mmol/L (ref 98–110)
Creat: 0.66 mg/dL (ref 0.50–1.10)
GFR, Est African American: 125 mL/min/{1.73_m2} (ref >=60)
GFR, Est Non African American: 107 mL/min/{1.73_m2} (ref >=60)
Globulin: 2.6 g/dL (calc) (ref 1.9–3.7)
Glucose, Bld: 116 mg/dL — ABNORMAL HIGH (ref 65–99)
Potassium: 4.6 mmol/L (ref 3.5–5.3)
Sodium: 139 mmol/L (ref 135–146)
Total Protein: 7 g/dL (ref 6.1–8.1)

## 2018-03-15 LAB — LIPID PANEL W/REFLEX DIRECT LDL
Cholesterol: 162 mg/dL (ref <200)
HDL: 53 mg/dL (ref >50)
LDL CHOLESTEROL (CALC): 90 mg/dL
Non-HDL Cholesterol (Calc): 109 mg/dL (calc) (ref <130)
Total CHOL/HDL Ratio: 3.1 (calc) (ref <5.0)
Triglycerides: 99 mg/dL (ref <150)

## 2018-03-15 NOTE — Progress Notes (Signed)
Call pt: cholesterol looks great. Thyroid barely in normal range. 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 Fasting sugar is also elevated. In 2 weeks lab only lets recheck thyroid and A1C to further evaluate.   Lauren Bowers please order labs.

## 2018-03-18 ENCOUNTER — Encounter: Payer: BLUE CROSS/BLUE SHIELD | Admitting: Physician Assistant

## 2018-03-25 ENCOUNTER — Telehealth: Payer: Self-pay | Admitting: Physician Assistant

## 2018-03-25 NOTE — Telephone Encounter (Signed)
Thank you :)

## 2018-03-25 NOTE — Telephone Encounter (Signed)
Can we find out where she does go and get records?

## 2018-03-25 NOTE — Telephone Encounter (Signed)
Called patient and she goes to [facility]. I sent a request for recent pap and mammogram. Patient states she would like to transfer to the OB in our office, and she will begin that transfer soon. No further questions or concerns at this time.

## 2018-03-25 NOTE — Telephone Encounter (Signed)
FYI: Ms. Duncombe is not a patient at [facility] ob[facility] per this office.  Thanks.

## 2018-03-28 ENCOUNTER — Other Ambulatory Visit: Payer: Self-pay | Admitting: Physician Assistant

## 2018-03-28 DIAGNOSIS — R7301 Impaired fasting glucose: Secondary | ICD-10-CM | POA: Diagnosis not present

## 2018-03-28 DIAGNOSIS — E0590 Thyrotoxicosis, unspecified without thyrotoxic crisis or storm: Secondary | ICD-10-CM | POA: Diagnosis not present

## 2018-03-29 LAB — HEMOGLOBIN A1C
Hgb A1c MFr Bld: 5.6 % of total Hgb (ref <5.7)
Mean Plasma Glucose: 114 (calc)
eAG (mmol/L): 6.3 (calc)

## 2018-03-29 LAB — TSH: TSH: 0.51 mIU/L

## 2018-03-29 NOTE — Progress Notes (Signed)
Call pt: A1C good. Almost in pre-diabetes range. So watch sugars and carbs but for now looks good. Thyroid up some in normal range. Still low normal. If no increase in anxiety or other hyperthyroid symptoms then ok to leave but if you are having any symptoms we could address.

## 2018-04-17 DIAGNOSIS — Z1231 Encounter for screening mammogram for malignant neoplasm of breast: Secondary | ICD-10-CM | POA: Diagnosis not present

## 2018-04-17 DIAGNOSIS — Z01419 Encounter for gynecological examination (general) (routine) without abnormal findings: Secondary | ICD-10-CM | POA: Diagnosis not present

## 2018-04-17 DIAGNOSIS — Z6835 Body mass index (BMI) 35.0-35.9, adult: Secondary | ICD-10-CM | POA: Diagnosis not present

## 2018-04-17 LAB — HM PAP SMEAR: HM Pap smear: NEGATIVE

## 2018-04-30 DIAGNOSIS — G629 Polyneuropathy, unspecified: Secondary | ICD-10-CM | POA: Diagnosis not present

## 2018-06-05 DIAGNOSIS — G629 Polyneuropathy, unspecified: Secondary | ICD-10-CM | POA: Diagnosis not present

## 2018-06-24 ENCOUNTER — Other Ambulatory Visit: Payer: Self-pay

## 2018-06-24 DIAGNOSIS — Z716 Tobacco abuse counseling: Secondary | ICD-10-CM

## 2018-06-24 MED ORDER — BUPROPION HCL ER (SR) 150 MG PO TB12: 150.0000 mg | ORAL_TABLET | Freq: Two times a day (BID) | ORAL | 0 refills | Status: DC

## 2018-06-26 ENCOUNTER — Ambulatory Visit (INDEPENDENT_AMBULATORY_CARE_PROVIDER_SITE_OTHER): Payer: BLUE CROSS/BLUE SHIELD | Admitting: Physician Assistant

## 2018-06-26 ENCOUNTER — Other Ambulatory Visit: Payer: Self-pay

## 2018-06-26 ENCOUNTER — Telehealth: Payer: Self-pay | Admitting: Physician Assistant

## 2018-06-26 ENCOUNTER — Encounter: Payer: Self-pay | Admitting: Physician Assistant

## 2018-06-26 VITALS — Temp 97.5°F | Wt 210.0 lb

## 2018-06-26 DIAGNOSIS — Z716 Tobacco abuse counseling: Secondary | ICD-10-CM

## 2018-06-26 DIAGNOSIS — G600 Hereditary motor and sensory neuropathy: Secondary | ICD-10-CM | POA: Diagnosis not present

## 2018-06-26 DIAGNOSIS — R4589 Other symptoms and signs involving emotional state: Secondary | ICD-10-CM | POA: Insufficient documentation

## 2018-06-26 DIAGNOSIS — F419 Anxiety disorder, unspecified: Secondary | ICD-10-CM

## 2018-06-26 DIAGNOSIS — F329 Major depressive disorder, single episode, unspecified: Secondary | ICD-10-CM

## 2018-06-26 DIAGNOSIS — F17210 Nicotine dependence, cigarettes, uncomplicated: Secondary | ICD-10-CM

## 2018-06-26 MED ORDER — BUPROPION HCL ER (XL) 150 MG PO TB24: 150.0000 mg | ORAL_TABLET | ORAL | 1 refills | Status: DC

## 2018-06-26 NOTE — Progress Notes (Signed)
Subjective:    Patient ID: Lauren Bowers, female    DOB: 1975/03/28, 43 y.o.   MRN: [id number]  HPI  Patient is a 43 year old female with recent diagnosis of CMT who presents to the clinic via WebEx for follow-up.  At last visit we discussed smoking cessation. She was doing really well and down to 5-6 cigs on wellbutrin SR once a day. She did not like the way she felt on twice a day. She has had some recent stressors with her mother being in the hospital with a brain hemorrhage and new dx of CMT for herself. She admits to smoking 10-11 cigs a day now but down from 1 pack a day.   She admits the dx of CMT has been hard. She is motivated to keep finding the best things to do so that she can stay walking. Her brother is in a wheelchair. Neurology wants her to go through genetic testing and get fitted for braces. Due to [place] pandemic she does not know when that will be.   .. Active Ambulatory Problems    Diagnosis Date Noted  . Depression 02/06/2014  . Anxiety disorder 02/06/2014  . Family planning counseling 02/06/2014  . Conjunctivitis 02/09/2014  . Early menopause occurring in patient age younger than 13 years 03/12/2018  . Class 1 obesity due to excess calories without serious comorbidity with body mass index (BMI) of 34.0 to 34.9 in adult 03/12/2018  . Family history of Charcot-Marie-Tooth disease 03/12/2018  . Neuropathy 03/12/2018  . Elevated fasting glucose 03/15/2018  . Low TSH level 03/15/2018  . Depressed mood 06/26/2018  . 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 No Resolved Ambulatory Problems   Past Medical History:  Diagnosis Date  . Allergy   . Anxiety       Review of Systems See HPI.     Objective:   Physical Exam Vitals signs reviewed.  HENT:     Head: Normocephalic.  Cardiovascular:     Rate and Rhythm: Normal rate.     Pulses: Normal pulses.  Pulmonary:     Effort: Pulmonary effort is  normal.  Neurological:     General: No focal deficit present.     Mental Status: She is alert.  Psychiatric:     Comments: tearful           Assessment & Plan:  Marland KitchenMarland KitchenDiagnoses and all orders for this visit:  CMT (Charcot-Marie-Tooth disease) -     Ambulatory referral to Psychology  Encounter for smoking cessation counseling -     buPROPion (WELLBUTRIN XL) 150 MG 24 hr tablet; Take 1 tablet (150 mg total) by mouth every morning.  Depressed mood -     buPROPion (WELLBUTRIN XL) 150 MG 24 hr tablet; Take 1 tablet (150 mg total) by mouth every morning. -     Ambulatory referral to Psychology  Anxiety disorder, unspecified type  certainly dx of CMT is harder for patient to deal with. We discussed counseling to work through her emotions. Agreed to talk with Janett Billow here in office. Will make referral. For now will stick with wellbutrin for smoking cessation and mood. Will change to XL and not SR since she did not like taking that 2nd dose.   I did spend some time encouraging smoking cessation. She agrees to try XL wellbutrin. Discussed cravings and how work through for 5-10 minutes.   I also encouraged her to reach out to neurology  for a specialist in this some she can learn and get the best advice. Perhaps even consider clinical trials.   Marland Kitchen.Spent 30 minutes with patient and greater than 50 percent of visit spent counseling patient regarding treatment plan.

## 2018-07-17 ENCOUNTER — Other Ambulatory Visit: Payer: Self-pay | Admitting: Physician Assistant

## 2018-07-17 DIAGNOSIS — Z716 Tobacco abuse counseling: Secondary | ICD-10-CM

## 2018-08-05 DIAGNOSIS — G8310 Monoplegia of lower limb affecting unspecified side: Secondary | ICD-10-CM | POA: Diagnosis not present

## 2018-08-13 DIAGNOSIS — G600 Hereditary motor and sensory neuropathy: Secondary | ICD-10-CM | POA: Diagnosis not present

## 2018-08-13 DIAGNOSIS — R2689 Other abnormalities of gait and mobility: Secondary | ICD-10-CM | POA: Diagnosis not present

## 2018-08-13 DIAGNOSIS — M21372 Foot drop, left foot: Secondary | ICD-10-CM | POA: Diagnosis not present

## 2018-08-13 DIAGNOSIS — M6281 Muscle weakness (generalized): Secondary | ICD-10-CM | POA: Diagnosis not present

## 2018-08-22 DIAGNOSIS — M6281 Muscle weakness (generalized): Secondary | ICD-10-CM | POA: Diagnosis not present

## 2018-08-22 DIAGNOSIS — R2689 Other abnormalities of gait and mobility: Secondary | ICD-10-CM | POA: Diagnosis not present

## 2018-08-22 DIAGNOSIS — M21372 Foot drop, left foot: Secondary | ICD-10-CM | POA: Diagnosis not present

## 2018-08-22 DIAGNOSIS — G600 Hereditary motor and sensory neuropathy: Secondary | ICD-10-CM | POA: Diagnosis not present

## 2018-08-28 DIAGNOSIS — R2689 Other abnormalities of gait and mobility: Secondary | ICD-10-CM | POA: Diagnosis not present

## 2018-08-28 DIAGNOSIS — G600 Hereditary motor and sensory neuropathy: Secondary | ICD-10-CM | POA: Diagnosis not present

## 2018-08-28 DIAGNOSIS — M6281 Muscle weakness (generalized): Secondary | ICD-10-CM | POA: Diagnosis not present

## 2018-08-28 DIAGNOSIS — M21372 Foot drop, left foot: Secondary | ICD-10-CM | POA: Diagnosis not present

## 2018-08-29 DIAGNOSIS — R2689 Other abnormalities of gait and mobility: Secondary | ICD-10-CM | POA: Diagnosis not present

## 2018-08-29 DIAGNOSIS — M21372 Foot drop, left foot: Secondary | ICD-10-CM | POA: Diagnosis not present

## 2018-08-29 DIAGNOSIS — M6281 Muscle weakness (generalized): Secondary | ICD-10-CM | POA: Diagnosis not present

## 2018-08-29 DIAGNOSIS — G600 Hereditary motor and sensory neuropathy: Secondary | ICD-10-CM | POA: Diagnosis not present

## 2018-09-02 DIAGNOSIS — R2689 Other abnormalities of gait and mobility: Secondary | ICD-10-CM | POA: Diagnosis not present

## 2018-09-02 DIAGNOSIS — M21372 Foot drop, left foot: Secondary | ICD-10-CM | POA: Diagnosis not present

## 2018-09-02 DIAGNOSIS — G600 Hereditary motor and sensory neuropathy: Secondary | ICD-10-CM | POA: Diagnosis not present

## 2018-09-02 DIAGNOSIS — M6281 Muscle weakness (generalized): Secondary | ICD-10-CM | POA: Diagnosis not present

## 2018-09-03 DIAGNOSIS — M21372 Foot drop, left foot: Secondary | ICD-10-CM | POA: Diagnosis not present

## 2018-09-03 DIAGNOSIS — R2689 Other abnormalities of gait and mobility: Secondary | ICD-10-CM | POA: Diagnosis not present

## 2018-09-03 DIAGNOSIS — M6281 Muscle weakness (generalized): Secondary | ICD-10-CM | POA: Diagnosis not present

## 2018-09-03 DIAGNOSIS — G600 Hereditary motor and sensory neuropathy: Secondary | ICD-10-CM | POA: Diagnosis not present

## 2018-09-06 NOTE — Telephone Encounter (Signed)
error 

## 2018-09-10 DIAGNOSIS — M6281 Muscle weakness (generalized): Secondary | ICD-10-CM | POA: Diagnosis not present

## 2018-09-10 DIAGNOSIS — G600 Hereditary motor and sensory neuropathy: Secondary | ICD-10-CM | POA: Diagnosis not present

## 2018-09-10 DIAGNOSIS — M21372 Foot drop, left foot: Secondary | ICD-10-CM | POA: Diagnosis not present

## 2018-09-10 DIAGNOSIS — R2689 Other abnormalities of gait and mobility: Secondary | ICD-10-CM | POA: Diagnosis not present

## 2018-09-17 DIAGNOSIS — M21372 Foot drop, left foot: Secondary | ICD-10-CM | POA: Diagnosis not present

## 2018-09-17 DIAGNOSIS — M6281 Muscle weakness (generalized): Secondary | ICD-10-CM | POA: Diagnosis not present

## 2018-09-17 DIAGNOSIS — G839 Paralytic syndrome, unspecified: Secondary | ICD-10-CM | POA: Diagnosis not present

## 2018-09-17 DIAGNOSIS — R2689 Other abnormalities of gait and mobility: Secondary | ICD-10-CM | POA: Diagnosis not present

## 2018-09-17 DIAGNOSIS — G600 Hereditary motor and sensory neuropathy: Secondary | ICD-10-CM | POA: Diagnosis not present

## 2018-09-18 DIAGNOSIS — G600 Hereditary motor and sensory neuropathy: Secondary | ICD-10-CM | POA: Diagnosis not present

## 2018-09-24 DIAGNOSIS — M21372 Foot drop, left foot: Secondary | ICD-10-CM | POA: Diagnosis not present

## 2018-09-24 DIAGNOSIS — R2689 Other abnormalities of gait and mobility: Secondary | ICD-10-CM | POA: Diagnosis not present

## 2018-09-24 DIAGNOSIS — M6281 Muscle weakness (generalized): Secondary | ICD-10-CM | POA: Diagnosis not present

## 2018-09-24 DIAGNOSIS — G600 Hereditary motor and sensory neuropathy: Secondary | ICD-10-CM | POA: Diagnosis not present

## 2018-09-26 ENCOUNTER — Encounter: Payer: Self-pay | Admitting: Physician Assistant

## 2018-10-02 DIAGNOSIS — R2689 Other abnormalities of gait and mobility: Secondary | ICD-10-CM | POA: Diagnosis not present

## 2018-10-02 DIAGNOSIS — M6281 Muscle weakness (generalized): Secondary | ICD-10-CM | POA: Diagnosis not present

## 2018-10-02 DIAGNOSIS — G600 Hereditary motor and sensory neuropathy: Secondary | ICD-10-CM | POA: Diagnosis not present

## 2018-10-02 DIAGNOSIS — R5383 Other fatigue: Secondary | ICD-10-CM | POA: Diagnosis not present

## 2018-10-02 DIAGNOSIS — M21372 Foot drop, left foot: Secondary | ICD-10-CM | POA: Diagnosis not present

## 2018-10-08 DIAGNOSIS — R278 Other lack of coordination: Secondary | ICD-10-CM | POA: Diagnosis not present

## 2018-10-08 DIAGNOSIS — R6889 Other general symptoms and signs: Secondary | ICD-10-CM | POA: Diagnosis not present

## 2018-10-08 DIAGNOSIS — G600 Hereditary motor and sensory neuropathy: Secondary | ICD-10-CM | POA: Diagnosis not present

## 2018-10-08 DIAGNOSIS — R202 Paresthesia of skin: Secondary | ICD-10-CM | POA: Diagnosis not present

## 2018-10-15 DIAGNOSIS — R278 Other lack of coordination: Secondary | ICD-10-CM | POA: Diagnosis not present

## 2018-10-15 DIAGNOSIS — G600 Hereditary motor and sensory neuropathy: Secondary | ICD-10-CM | POA: Diagnosis not present

## 2018-10-15 DIAGNOSIS — R6889 Other general symptoms and signs: Secondary | ICD-10-CM | POA: Diagnosis not present

## 2018-10-15 DIAGNOSIS — R202 Paresthesia of skin: Secondary | ICD-10-CM | POA: Diagnosis not present

## 2018-10-22 DIAGNOSIS — G600 Hereditary motor and sensory neuropathy: Secondary | ICD-10-CM | POA: Diagnosis not present

## 2018-10-22 DIAGNOSIS — R202 Paresthesia of skin: Secondary | ICD-10-CM | POA: Diagnosis not present

## 2018-10-22 DIAGNOSIS — R278 Other lack of coordination: Secondary | ICD-10-CM | POA: Diagnosis not present

## 2018-10-22 DIAGNOSIS — R6889 Other general symptoms and signs: Secondary | ICD-10-CM | POA: Diagnosis not present

## 2018-10-29 DIAGNOSIS — M21372 Foot drop, left foot: Secondary | ICD-10-CM | POA: Diagnosis not present

## 2018-10-29 DIAGNOSIS — R202 Paresthesia of skin: Secondary | ICD-10-CM | POA: Diagnosis not present

## 2018-10-29 DIAGNOSIS — M6281 Muscle weakness (generalized): Secondary | ICD-10-CM | POA: Diagnosis not present

## 2018-10-29 DIAGNOSIS — G600 Hereditary motor and sensory neuropathy: Secondary | ICD-10-CM | POA: Diagnosis not present

## 2018-11-05 DIAGNOSIS — M6281 Muscle weakness (generalized): Secondary | ICD-10-CM | POA: Diagnosis not present

## 2018-11-05 DIAGNOSIS — G600 Hereditary motor and sensory neuropathy: Secondary | ICD-10-CM | POA: Diagnosis not present

## 2018-11-05 DIAGNOSIS — M21372 Foot drop, left foot: Secondary | ICD-10-CM | POA: Diagnosis not present

## 2018-11-05 DIAGNOSIS — R202 Paresthesia of skin: Secondary | ICD-10-CM | POA: Diagnosis not present

## 2018-11-13 DIAGNOSIS — M21372 Foot drop, left foot: Secondary | ICD-10-CM | POA: Diagnosis not present

## 2018-11-13 DIAGNOSIS — G600 Hereditary motor and sensory neuropathy: Secondary | ICD-10-CM | POA: Diagnosis not present

## 2018-11-13 DIAGNOSIS — M6281 Muscle weakness (generalized): Secondary | ICD-10-CM | POA: Diagnosis not present

## 2018-11-13 DIAGNOSIS — R202 Paresthesia of skin: Secondary | ICD-10-CM | POA: Diagnosis not present

## 2018-11-20 DIAGNOSIS — M21372 Foot drop, left foot: Secondary | ICD-10-CM | POA: Diagnosis not present

## 2018-11-20 DIAGNOSIS — G600 Hereditary motor and sensory neuropathy: Secondary | ICD-10-CM | POA: Diagnosis not present

## 2018-11-20 DIAGNOSIS — R202 Paresthesia of skin: Secondary | ICD-10-CM | POA: Diagnosis not present

## 2018-11-20 DIAGNOSIS — M6281 Muscle weakness (generalized): Secondary | ICD-10-CM | POA: Diagnosis not present

## 2018-11-27 DIAGNOSIS — R2689 Other abnormalities of gait and mobility: Secondary | ICD-10-CM | POA: Diagnosis not present

## 2018-11-27 DIAGNOSIS — R202 Paresthesia of skin: Secondary | ICD-10-CM | POA: Diagnosis not present

## 2018-11-27 DIAGNOSIS — G600 Hereditary motor and sensory neuropathy: Secondary | ICD-10-CM | POA: Diagnosis not present

## 2018-12-04 DIAGNOSIS — R202 Paresthesia of skin: Secondary | ICD-10-CM | POA: Diagnosis not present

## 2018-12-04 DIAGNOSIS — R2689 Other abnormalities of gait and mobility: Secondary | ICD-10-CM | POA: Diagnosis not present

## 2018-12-04 DIAGNOSIS — G600 Hereditary motor and sensory neuropathy: Secondary | ICD-10-CM | POA: Diagnosis not present

## 2018-12-11 DIAGNOSIS — R2689 Other abnormalities of gait and mobility: Secondary | ICD-10-CM | POA: Diagnosis not present

## 2018-12-11 DIAGNOSIS — G600 Hereditary motor and sensory neuropathy: Secondary | ICD-10-CM | POA: Diagnosis not present

## 2018-12-11 DIAGNOSIS — R202 Paresthesia of skin: Secondary | ICD-10-CM | POA: Diagnosis not present

## 2019-01-02 DIAGNOSIS — E559 Vitamin D deficiency, unspecified: Secondary | ICD-10-CM | POA: Diagnosis not present

## 2019-01-02 DIAGNOSIS — G600 Hereditary motor and sensory neuropathy: Secondary | ICD-10-CM | POA: Diagnosis not present

## 2019-01-23 ENCOUNTER — Ambulatory Visit (INDEPENDENT_AMBULATORY_CARE_PROVIDER_SITE_OTHER): Payer: BC Managed Care – PPO | Admitting: Family Medicine

## 2019-01-23 ENCOUNTER — Encounter: Payer: Self-pay | Admitting: Family Medicine

## 2019-01-23 ENCOUNTER — Other Ambulatory Visit: Payer: Self-pay

## 2019-01-23 VITALS — Temp 98.5°F

## 2019-01-23 DIAGNOSIS — Z20828 Contact with and (suspected) exposure to other viral communicable diseases: Secondary | ICD-10-CM

## 2019-01-23 DIAGNOSIS — R05 Cough: Secondary | ICD-10-CM | POA: Diagnosis not present

## 2019-01-23 DIAGNOSIS — R059 Cough, unspecified: Secondary | ICD-10-CM

## 2019-01-23 DIAGNOSIS — J40 Bronchitis, not specified as acute or chronic: Secondary | ICD-10-CM | POA: Diagnosis not present

## 2019-01-23 DIAGNOSIS — Z20822 Contact with and (suspected) exposure to covid-19: Secondary | ICD-10-CM

## 2019-01-23 NOTE — Progress Notes (Signed)
Virtual Visit via Telephone Note  I connected with [name] on 01/23/19 at  2:00 PM EDT by telephone and verified that I am speaking with the correct person using two identifiers.   I discussed the limitations, risks, security and privacy concerns of performing an evaluation and management service by telephone and the availability of in person appointments. I also discussed with the patient that there may be a patient responsible charge related to this service. The patient expressed understanding and agreed to proceed.   Acute Office Visit  Subjective:    Patient ID: Lauren Bowers, female    DOB: 30-May-1975, 43 y.o.   MRN: [id number]  No chief complaint on file.   HPI Patient is in today for dry cough that started yesterday.  Cough is painful in her chest but has some phlegm but it clear.  Had some diarrhea this afternoon. No fever, chills o sweats.  No SOB.  No HA. No loss of taste or smell. Has had some sweating but going through menopause. No sinus sxs.  Mild scratchy throat. No spots on her throat.  No swollen glands. No known COVID exposure. She works form home. Lives with her husband who does AC/heat so in people's home and her daughter.    Past Medical History:  Diagnosis Date  . Allergy   . Anxiety   . Depression     Past Surgical History:  Procedure Laterality Date  . CHOLECYSTECTOMY      Family History  Problem Relation Age of Onset  . Heart disease Father 43       AMI/stenting  . Hypertension Father   . Hyperlipidemia Father   . Multiple sclerosis Father   . Charcot-Marie-Tooth disease Father   . Ovarian cancer Maternal Grandmother   . Heart disease Maternal Grandfather   . Cancer Mother   . Stroke Mother     Social History   Socioeconomic History  . Marital status: Married    Spouse name: Not on file  . Number of children: Not on file  . Years of education: Not on file  . Highest education level: Not on file  Occupational History  . Not on file   Social Needs  . Financial resource strain: Not on file  . Food insecurity    Worry: Not on file    Inability: Not on file  . Transportation needs    Medical: Not on file    Non-medical: Not on file  Tobacco Use  . Smoking status: Current Every Day Smoker    Packs/day: 0.50    Years: 20.00    Pack years: 10.00    Types: Cigarettes    Start date: 10/23/1992  . Smokeless tobacco: Never Used  Substance and Sexual Activity  . Alcohol use: Yes  . Drug use: No  . Sexual activity: Yes    Birth control/protection: Pill  Lifestyle  . Physical activity    Days per week: Not on file    Minutes per session: Not on file  . Stress: Not on file  Relationships  . Social Herbalist on phone: Not on file    Gets together: Not on file    Attends religious service: Not on file    Active member of club or organization: Not on file    Attends meetings of clubs or organizations: Not on file    Relationship status: Not on file  . Intimate partner violence    Fear of current or  ex partner: Not on file    Emotionally abused: Not on file    Physically abused: Not on file    Forced sexual activity: Not on file  Other Topics Concern  . Not on file  Social History Narrative   Marital status: married      Child: one      Lives: with husband, child.      Employment:  Call center for [employer].          Outpatient Medications Prior to Visit  Medication Sig Dispense Refill  . buPROPion (WELLBUTRIN XL) 150 MG 24 hr tablet Take 1 tablet (150 mg total) by mouth every morning. 90 tablet 1  . norethindrone-ethinyl estradiol-iron (ESTROSTEP FE,TILIA FE,TRI-LEGEST FE) 1-20/1-30/1-35 MG-MCG tablet Take 1 tablet by mouth daily.     No facility-administered medications prior to visit.     Allergies  Allergen Reactions  . Bactrim [Sulfamethoxazole-Trimethoprim] Hives    ROS     Objective:    Physical Exam  Constitutional: She is oriented to person, place, and time.   Pulmonary/Chest: Effort normal.  Cough sounded dry over the phone.   Neurological: She is alert and oriented to person, place, and time.  Psychiatric: She has a normal mood and affect.    Temp 98.5 F (36.9 C)  Wt Readings from Last 3 Encounters:  06/26/18 210 lb (95.3 kg)  03/12/18 213 lb (96.6 kg)  12/12/17 200 lb (90.7 kg)    Health Maintenance Due  Topic Date Due  . MAMMOGRAM  10/24/1991  . INFLUENZA VACCINE  10/26/2018    There are no preventive care reminders to display for this patient.   Lab Results  Component Value Date   TSH 0.51 03/28/2018   Lab Results  Component Value Date   WBC 19.6 (H) 03/05/2014   HGB 12.7 03/05/2014   HCT 36.7 03/05/2014   MCV 90.6 03/05/2014   PLT 366 03/05/2014   Lab Results  Component Value Date   NA 139 03/14/2018   K 4.6 03/14/2018   CO2 27 03/14/2018   GLUCOSE 116 (H) 03/14/2018   BUN 19 03/14/2018   CREATININE 0.66 03/14/2018   BILITOT 0.3 03/14/2018   AST 22 03/14/2018   ALT 25 03/14/2018   PROT 7.0 03/14/2018   CALCIUM 9.7 03/14/2018   Lab Results  Component Value Date   CHOL 162 03/14/2018   Lab Results  Component Value Date   HDL 53 03/14/2018   Lab Results  Component Value Date   LDLCALC 90 03/14/2018   Lab Results  Component Value Date   TRIG 99 03/14/2018   Lab Results  Component Value Date   CHOLHDL 3.1 03/14/2018   Lab Results  Component Value Date   HGBA1C 5.6 03/28/2018       Assessment & Plan:   Problem List Items Addressed This Visit    None    Visit Diagnoses    Cough    -  Primary   Bronchitis       Suspected COVID-19 virus infection         Cough/bronchitis-it sounds like it is mostly in her chest and she is getting some chest pain with the cough.  We discussed using things like Delsym and then Tylenol or ibuprofen as needed.  I did recommend that she go ahead and get Covid tested since there is a high and significant increase in cases in [place] as well as the local  area.  That way if it  is negative then she can feel safe for her to be around her family.  Call back on Monday if she feels like she is not starting to improve.  Also if she feels like she is developing new symptoms she can call us back sooner.  Make sure hydrating well.  If any point she gets shortness of breath or fever then please contact her office or seek emergency medical care.  No orders of the defined types were placed in this encounter.     I discussed the assessment and treatment plan with the patient. The patient was provided an opportunity to ask questions and all were answered. The patient agreed with the plan and demonstrated an understanding of the instructions.   The patient was advised to call back or seek an in-person evaluation if the symptoms worsen or if the condition fails to improve as anticipated.  I provided 18 minutes of non-face-to-face time during this encounter.  Beatrice Lecher, MD

## 2019-01-24 DIAGNOSIS — Z20828 Contact with and (suspected) exposure to other viral communicable diseases: Secondary | ICD-10-CM | POA: Diagnosis not present

## 2019-03-11 ENCOUNTER — Other Ambulatory Visit: Payer: Self-pay | Admitting: Physician Assistant

## 2019-03-11 DIAGNOSIS — R7301 Impaired fasting glucose: Secondary | ICD-10-CM

## 2019-03-11 DIAGNOSIS — E0590 Thyrotoxicosis, unspecified without thyrotoxic crisis or storm: Secondary | ICD-10-CM

## 2019-06-11 ENCOUNTER — Encounter: Payer: Self-pay | Admitting: Obstetrics & Gynecology

## 2019-06-19 ENCOUNTER — Other Ambulatory Visit: Payer: Self-pay

## 2019-06-19 ENCOUNTER — Ambulatory Visit (INDEPENDENT_AMBULATORY_CARE_PROVIDER_SITE_OTHER): Payer: BC Managed Care – PPO | Admitting: Obstetrics & Gynecology

## 2019-06-19 ENCOUNTER — Encounter: Payer: Self-pay | Admitting: Obstetrics & Gynecology

## 2019-06-19 VITALS — BP 113/77 | HR 83 | Temp 98.6°F | Resp 16 | Ht 66.0 in | Wt 219.0 lb

## 2019-06-19 DIAGNOSIS — Z1151 Encounter for screening for human papillomavirus (HPV): Secondary | ICD-10-CM | POA: Diagnosis not present

## 2019-06-19 DIAGNOSIS — Z3009 Encounter for other general counseling and advice on contraception: Secondary | ICD-10-CM

## 2019-06-19 DIAGNOSIS — Z01419 Encounter for gynecological examination (general) (routine) without abnormal findings: Secondary | ICD-10-CM

## 2019-06-19 DIAGNOSIS — Z8041 Family history of malignant neoplasm of ovary: Secondary | ICD-10-CM

## 2019-06-19 DIAGNOSIS — Z1231 Encounter for screening mammogram for malignant neoplasm of breast: Secondary | ICD-10-CM

## 2019-06-19 DIAGNOSIS — Z124 Encounter for screening for malignant neoplasm of cervix: Secondary | ICD-10-CM

## 2019-06-19 MED ORDER — NORETHINDRONE 0.35 MG PO TABS: 1.0000 | ORAL_TABLET | Freq: Every day | ORAL | 11 refills | Status: DC

## 2019-06-19 NOTE — Progress Notes (Signed)
Subjective:     Lauren Bowers is a 44 y.o. female here for a routine exam.  Current complaints: Pt with no GYN issues. Transferred from [facility]  Due to location of facility.  Reports a FH of breast and ovarian cancer in her mother, grandmother and her [place] aunt. Her mother had ovarian cancer in her 37's. Pt did not get genetic testing due to cost.  Reports h/o depression. Stable off meds at present. Reports tob use.       Gynecologic History Patient's last menstrual period was 04/16/2019. Contraception: oral progesterone-only contraceptive Last mammogram: Jan 2020 Results were: normal  Obstetric History OB History  Gravida Para Term Preterm AB Living  4 1 1  0 3 1  SAB TAB Ectopic Multiple Live Births  3       1    # Outcome Date GA Lbr Len/2nd Weight Sex Delivery Anes PTL Lv  4 Term 2005    F    LIV  3 SAB           2 SAB           1 SAB              The following portions of the patient's history were reviewed and updated as appropriate: allergies, current medications, past family history, past medical history, past social history, past surgical history and problem list.  Review of Systems Pertinent items are noted in HPI.    Objective:  BP 113/77   Pulse 83   Temp 98.6 F (37 C)   Resp 16   Ht 5\' 6"  (1.676 m)   Wt 219 lb (99.3 kg)   LMP 04/16/2019   Breastfeeding No   BMI 35.35 kg/m  General Appearance:    Alert, cooperative, no distress, appears stated age  Head:    Normocephalic, without obvious abnormality, atraumatic  Eyes:    conjunctiva/corneas clear, EOM's intact, both eyes  Ears:    Normal external ear canals, both ears  Nose:   Nares normal, septum midline, mucosa normal, no drainage    or sinus tenderness  Throat:   Lips, mucosa, and tongue normal; teeth and gums normal  Neck:   Supple, symmetrical, trachea midline, no adenopathy;    thyroid:  no enlargement/tenderness/nodules  Back:     Symmetric, no curvature, ROM normal, no CVA tenderness   Lungs:     respirations unlabored  Chest Wall:    No tenderness or deformity   Heart:    Regular rate and rhythm  Breast Exam:    No tenderness, masses, or nipple abnormality  Abdomen:     Soft, non-tender, bowel sounds active all four quadrants,    no masses, no organomegaly  Genitalia:    Normal female without lesion, discharge or tenderness     Extremities:   Extremities normal, atraumatic, no cyanosis or edema  Pulses:   2+ and symmetric all extremities  Skin:   Skin color, texture, turgor normal, no rashes or lesions    Assessment:    Healthy female exam.   Undesired fertility. Concerned about getting pregnant due ot h/o 3 prior losses.  Current tobacco use. OCPs contraindicated.  FH of ovarian and breast cancer. I discussed with pt a salpingectomy for the dual purpose of pregnancy prevention and decrease in the incidence of ovarian cancer. She wants to think about it and call back.      Plan:   F/u PAP and hrHPV Screening mammogram  Pt to  call back if she desires a salpingectomy F/u in 1 year or sooner prn   Ahsha Hinsley L. Harraway-Smith, M.D., Cherlynn June

## 2019-06-19 NOTE — Patient Instructions (Signed)
Salpingectomy Salpingectomy, also called tubectomy, is the surgical removal of one of the fallopian tubes. The fallopian tubes are where eggs travel from the ovaries to the uterus. Removing one fallopian tube does not prevent you from becoming pregnant. It also does not cause problems with your menstrual periods. You may need this procedure if you:  Have a fertilized egg that attaches to the fallopian tube (ectopic pregnancy), especially one that causes the tube to burst or tear (rupture).  Have an infected fallopian tube.  Have cancer of the fallopian tube or nearby organs.  Have had an ovary removed due to a cyst or tumor.  Have had your uterus removed.  Are at high risk for ovarian cancer. There are three different methods that can be used for a salpingectomy:  An open method that involves making one large incision in your abdomen.  A laparoscopic method that involves using a thin, lighted tube with a tiny camera on the end (laparoscope) to help perform the procedure. The laparoscope will allow your surgeon to make several small incisions in the abdomen instead of one large incision.  A robot-assisted method that involves using a computer to control surgical instruments that are attached to robotic arms. Tell a health care provider about:  Any allergies you have.  All medicines you are taking, including vitamins, herbs, eye drops, creams, and over-the-counter medicines.  Any problems you or family members have had with anesthetic medicines.  Any blood disorders you have.  Any surgeries you have had.  Any medical conditions you have.  Whether you are pregnant or may be pregnant. What are the risks? Generally, this is a safe procedure. However, problems may occur, including:  Infection.  Bleeding.  Allergic reactions to medicines.  Blood clots in the legs or lungs.  Damage to other structures or organs. What happens before the procedure? Medicines  Ask your health  care provider about: ? Changing or stopping your regular medicines. This is especially important if you are taking diabetes medicines or blood thinners. ? Taking medicines such as aspirin and ibuprofen. These medicines can thin your blood. Do not take these medicines unless your health care provider tells you to take them. ? Taking over-the-counter medicines, vitamins, herbs, and supplements. Staying hydrated Follow instructions from your health care provider about hydration, which may include:  Up to 2 hours before the procedure - you may continue to drink clear liquids, such as water, clear fruit juice, black coffee, and plain tea. Eating and drinking restrictions Follow instructions from your health care provider about eating and drinking, which may include:  8 hours before the procedure - stop eating heavy meals or foods, such as meat, fried foods, or fatty foods.  6 hours before the procedure - stop eating light meals or foods, such as toast or cereal.  6 hours before the procedure - stop drinking milk or drinks that contain milk.  2 hours before the procedure - stop drinking clear liquids. General instructions  Do not use any products that contain nicotine or tobacco for at least 4 weeks before the procedure. These products include cigarettes, e-cigarettes, and chewing tobacco. If you need help quitting, ask your health care provider.  You may have an exam or tests, such as: ? An electrocardiogram (ECG). ? A blood or urine test.  Ask your health care provider what steps will be taken to help prevent infection. These may include: ? Removing hair at the surgery site. ? Washing skin with a germ-killing soap. ?   Taking antibiotic medicine.  Plan to have someone take you home from the hospital or clinic.  If you will be going home right after the procedure, plan to have someone with you for 24 hours. What happens during the procedure?  An IV will be inserted into one of your  veins.  You will be given one or more of the following: ? A medicine to help you relax (sedative). ? A medicine to make you fall asleep (general anesthetic).  A thin tube (catheter) may be inserted through your urethra and into your bladder to drain urine during your procedure.  Depending on the type of procedure you are having, one incision or several small incisions will be made in your abdomen.  Your fallopian tube will be cut and removed from where it attaches to your uterus.  Your blood vessels will be clamped and tied to prevent excess bleeding.  The incisions in your abdomen will be closed with stitches (sutures), staples, or skin glue.  A bandage (dressing) may be placed over your incisions. The procedure may vary among health care providers and hospitals. What happens after the procedure?   Your blood pressure, heart rate, breathing rate, and blood oxygen level will be monitored until you leave the hospital.  You may continue to receive fluids and medicines through an IV.  You may continue to have a catheter draining your urine.  You may have to wear compression stockings. These stockings help to prevent blood clots and reduce swelling in your legs.  You will be given pain medicine as needed.  Do not drive for 24 hours if you were given a sedative during your procedure. Summary  Salpingectomy is a surgical procedure to remove one of the fallopian tubes.  The procedure may be done with an open incision, a laparoscope, or computer-controlled instruments.  Depending on the type of procedure you are having, one incision or several small incisions will be made in your abdomen.  Your blood pressure, heart rate, breathing rate, and blood oxygen level will be monitored until you leave the hospital.  Plan to have someone take you home from the hospital or clinic. This information is not intended to replace advice given to you by your health care provider. Make sure you  discuss any questions you have with your health care provider. Document Revised: 03/04/2018 Document Reviewed: 03/04/2018 Elsevier Patient Education  [place].

## 2019-06-19 NOTE — Progress Notes (Signed)
Last mammogram 1/20 @ [facility]

## 2019-06-20 LAB — CYTOLOGY - PAP
Adequacy: ABSENT
Comment: NEGATIVE
Diagnosis: NEGATIVE
High risk HPV: NEGATIVE

## 2019-07-03 ENCOUNTER — Other Ambulatory Visit: Payer: Self-pay

## 2019-07-03 ENCOUNTER — Ambulatory Visit (INDEPENDENT_AMBULATORY_CARE_PROVIDER_SITE_OTHER): Payer: BC Managed Care – PPO

## 2019-07-03 DIAGNOSIS — Z01419 Encounter for gynecological examination (general) (routine) without abnormal findings: Secondary | ICD-10-CM | POA: Diagnosis not present

## 2019-07-03 DIAGNOSIS — G600 Hereditary motor and sensory neuropathy: Secondary | ICD-10-CM | POA: Diagnosis not present

## 2019-07-03 DIAGNOSIS — Z1231 Encounter for screening mammogram for malignant neoplasm of breast: Secondary | ICD-10-CM | POA: Diagnosis not present

## 2019-07-07 ENCOUNTER — Other Ambulatory Visit: Payer: Self-pay | Admitting: Obstetrics & Gynecology

## 2019-07-07 DIAGNOSIS — R928 Other abnormal and inconclusive findings on diagnostic imaging of breast: Secondary | ICD-10-CM

## 2019-08-12 ENCOUNTER — Telehealth: Payer: Self-pay

## 2019-08-12 NOTE — Telephone Encounter (Signed)
Patient called and left message for her to return call to office. Also sending my chart message.

## 2019-08-12 NOTE — Telephone Encounter (Signed)
-----   Message from Lavonia Drafts, MD sent at 08/11/2019  8:45 PM EDT ----- Please call pt she needs follow up imaging from her incomplete mammogram and document.   Thx,  Cl[facility]-S

## 2019-09-03 ENCOUNTER — Other Ambulatory Visit: Payer: Self-pay | Admitting: Obstetrics & Gynecology

## 2019-09-03 ENCOUNTER — Ambulatory Visit
Admission: RE | Admit: 2019-09-03 | Discharge: 2019-09-03 | Disposition: A | Discharge status: Home / Self Care | Payer: BC Managed Care – PPO | Source: Ambulatory Visit | Attending: Obstetrics & Gynecology | Admitting: Obstetrics & Gynecology

## 2019-09-03 ENCOUNTER — Other Ambulatory Visit: Payer: Self-pay

## 2019-09-03 DIAGNOSIS — R928 Other abnormal and inconclusive findings on diagnostic imaging of breast: Secondary | ICD-10-CM

## 2019-09-03 DIAGNOSIS — N6489 Other specified disorders of breast: Secondary | ICD-10-CM | POA: Diagnosis not present

## 2019-09-04 ENCOUNTER — Other Ambulatory Visit: Payer: BC Managed Care – PPO

## 2019-09-10 DIAGNOSIS — M79671 Pain in right foot: Secondary | ICD-10-CM | POA: Diagnosis not present

## 2019-09-10 DIAGNOSIS — L600 Ingrowing nail: Secondary | ICD-10-CM | POA: Diagnosis not present

## 2019-09-10 DIAGNOSIS — M79672 Pain in left foot: Secondary | ICD-10-CM | POA: Diagnosis not present

## 2020-03-09 ENCOUNTER — Ambulatory Visit (INDEPENDENT_AMBULATORY_CARE_PROVIDER_SITE_OTHER): Payer: BC Managed Care – PPO | Admitting: Physician Assistant

## 2020-03-09 ENCOUNTER — Other Ambulatory Visit: Payer: BC Managed Care – PPO

## 2020-03-09 ENCOUNTER — Other Ambulatory Visit: Payer: Self-pay

## 2020-03-09 VITALS — BP 100/50 | HR 84 | Ht 66.0 in | Wt 196.0 lb

## 2020-03-09 DIAGNOSIS — Z6834 Body mass index (BMI) 34.0-34.9, adult: Secondary | ICD-10-CM

## 2020-03-09 DIAGNOSIS — E6609 Other obesity due to excess calories: Secondary | ICD-10-CM

## 2020-03-09 DIAGNOSIS — E559 Vitamin D deficiency, unspecified: Secondary | ICD-10-CM

## 2020-03-09 DIAGNOSIS — Z0000 Encounter for general adult medical examination without abnormal findings: Secondary | ICD-10-CM

## 2020-03-09 DIAGNOSIS — Z1329 Encounter for screening for other suspected endocrine disorder: Secondary | ICD-10-CM

## 2020-03-09 DIAGNOSIS — Z8041 Family history of malignant neoplasm of ovary: Secondary | ICD-10-CM

## 2020-03-09 DIAGNOSIS — E041 Nontoxic single thyroid nodule: Secondary | ICD-10-CM

## 2020-03-09 DIAGNOSIS — R7989 Other specified abnormal findings of blood chemistry: Secondary | ICD-10-CM

## 2020-03-09 DIAGNOSIS — Z1211 Encounter for screening for malignant neoplasm of colon: Secondary | ICD-10-CM

## 2020-03-09 DIAGNOSIS — R7301 Impaired fasting glucose: Secondary | ICD-10-CM | POA: Diagnosis not present

## 2020-03-09 DIAGNOSIS — H9193 Unspecified hearing loss, bilateral: Secondary | ICD-10-CM

## 2020-03-09 DIAGNOSIS — Z13220 Encounter for screening for lipoid disorders: Secondary | ICD-10-CM

## 2020-03-09 DIAGNOSIS — Z3009 Encounter for other general counseling and advice on contraception: Secondary | ICD-10-CM

## 2020-03-09 DIAGNOSIS — H9313 Tinnitus, bilateral: Secondary | ICD-10-CM

## 2020-03-09 DIAGNOSIS — Z131 Encounter for screening for diabetes mellitus: Secondary | ICD-10-CM

## 2020-03-09 DIAGNOSIS — Z1159 Encounter for screening for other viral diseases: Secondary | ICD-10-CM

## 2020-03-09 MED ORDER — FLUTICASONE PROPIONATE 50 MCG/ACT NA SUSP: 2.0000 | Freq: Every day | NASAL | 1 refills | Status: DC

## 2020-03-09 MED ORDER — NORETHINDRONE 0.35 MG PO TABS: 1.0000 | ORAL_TABLET | Freq: Every day | ORAL | 4 refills | Status: DC

## 2020-03-09 NOTE — Patient Instructions (Signed)
Tinnitus Tinnitus refers to hearing a sound when there is no actual source for that sound. This is often described as ringing in the ears. However, people with this condition may hear a variety of noises, in one ear or in both ears. The sounds of tinnitus can be soft, loud, or somewhere in between. Tinnitus can last for a few seconds or can be constant for days. It may go away without treatment and come back at various times. When tinnitus is constant or happens often, it can lead to other problems, such as trouble sleeping and trouble concentrating. Almost everyone experiences tinnitus at some point. Tinnitus that is long-lasting (chronic) or comes back often (recurs) may require medical attention. What are the causes? The cause of tinnitus is often not known. In some cases, it can result from:  Exposure to loud noises from machinery, music, or other sources.  An object (foreign body) stuck in the ear.  Earwax buildup.  Drinking alcohol or caffeine.  Taking certain medicines.  Age-related hearing loss. It may also be caused by medical conditions such as:  Ear or sinus infections.  High blood pressure.  Heart diseases.  Anemia.  Allergies.  Meniere's disease.  Thyroid problems.  Tumors.  A weak, bulging blood vessel (aneurysm) near the ear. What are the signs or symptoms? The main symptom of tinnitus is hearing a sound when there is no source for that sound. It may sound like:  Buzzing.  Roaring.  Ringing.  Blowing air.  Hissing.  Whistling.  Sizzling.  Humming.  Running water.  A musical note.  Tapping. Symptoms may affect only one ear (unilateral) or both ears (bilateral). How is this diagnosed? Tinnitus is diagnosed based on your symptoms, your medical history, and a physical exam. Your health care provider may do a thorough hearing test (audiologic exam) if your tinnitus:  Is unilateral.  Causes hearing difficulties.  Lasts 6 months or  longer. You may work with a health care provider who specializes in hearing disorders (audiologist). You may be asked questions about your symptoms and how they affect your daily life. You may have other tests done, such as:  CT scan.  MRI.  An imaging test of how blood flows through your blood vessels (angiogram). How is this treated? Treating an underlying medical condition can sometimes make tinnitus go away. If your tinnitus continues, other treatments may include:  Medicines.  Therapy and counseling to help you manage the stress of living with tinnitus.  Sound generators to mask the tinnitus. These include: ? Tabletop sound machines that play relaxing sounds to help you fall asleep. ? Wearable devices that fit in your ear and play sounds or music. ? Acoustic neural stimulation. This involves using headphones to listen to music that contains an auditory signal. Over time, listening to this signal may change some pathways in your brain and make you less sensitive to tinnitus. This treatment is used for very severe cases when no other treatment is working.  Using hearing aids or cochlear implants if your tinnitus is related to hearing loss. Hearing aids are worn in the outer ear. Cochlear implants are surgically placed in the inner ear. Follow these instructions at home: Managing symptoms      When possible, avoid being in loud places and being exposed to loud sounds.  Wear hearing protection, such as earplugs, when you are exposed to loud noises.  Use a white noise machine, a humidifier, or other devices to mask the sound of tinnitus.    Practice techniques for reducing stress, such as meditation, yoga, or deep breathing. Work with your health care provider if you need help with managing stress.  Sleep with your head slightly raised. This may reduce the impact of tinnitus. General instructions  Do not use stimulants, such as nicotine, alcohol, or caffeine. Talk with your health  care provider about other stimulants to avoid. Stimulants are substances that can make you feel alert and attentive by increasing certain activities in the body (such as heart rate and blood pressure). These substances may make tinnitus worse.  Take over-the-counter and prescription medicines only as told by your health care provider.  Try to get plenty of sleep each night.  Keep all follow-up visits as told by your health care provider. This is important. Contact a health care provider if:  Your tinnitus continues for 3 weeks or longer without stopping.  You develop sudden hearing loss.  Your symptoms get worse or do not get better with home care.  You feel you are not able to manage the stress of living with tinnitus. Get help right away if:  You develop tinnitus after a head injury.  You have tinnitus along with any of the following: ? Dizziness. ? Loss of balance. ? Nausea and vomiting. ? Sudden, severe headache. These symptoms may represent a serious problem that is an emergency. Do not wait to see if the symptoms will go away. Get medical help right away. Call your local emergency services (911 in the [place] Do not drive yourself to the hospital. Summary  Tinnitus refers to hearing a sound when there is no actual source for that sound. This is often described as ringing in the ears.  Symptoms may affect only one ear (unilateral) or both ears (bilateral).  Use a white noise machine, a humidifier, or other devices to mask the sound of tinnitus.  Do not use stimulants, such as nicotine, alcohol, or caffeine. Talk with your health care provider about other stimulants to avoid. These substances may make tinnitus worse. This information is not intended to replace advice given to you by your health care provider. Make sure you discuss any questions you have with your health care provider. Document Revised: 09/25/2018 Document Reviewed: 12/21/2016 Elsevier Patient Education  2020  [place] Maintenance, Female Adopting a healthy lifestyle and getting preventive care are important in promoting health and wellness. Ask your health care provider about:  The right schedule for you to have regular tests and exams.  Things you can do on your own to prevent diseases and keep yourself healthy. What should I know about diet, weight, and exercise? Eat a healthy diet   Eat a diet that includes plenty of vegetables, fruits, low-fat dairy products, and lean protein.  Do not eat a lot of foods that are high in solid fats, added sugars, or sodium. Maintain a healthy weight Body mass index (BMI) is used to identify weight problems. It estimates body fat based on height and weight. Your health care provider can help determine your BMI and help you achieve or maintain a healthy weight. Get regular exercise Get regular exercise. This is one of the most important things you can do for your health. Most adults should:  Exercise for at least 150 minutes each week. The exercise should increase your heart rate and make you sweat (moderate-intensity exercise).  Do strengthening exercises at least twice a week. This is in addition to the moderate-intensity exercise.  Spend less time sitting. Even light physical  activity can be beneficial. Watch cholesterol and blood lipids Have your blood tested for lipids and cholesterol at 46 years of age, then have this test every 5 years. Have your cholesterol levels checked more often if:  Your lipid or cholesterol levels are high.  You are older than 46 years of age.  You are at high risk for heart disease. What should I know about cancer screening? Depending on your health history and family history, you may need to have cancer screening at various ages. This may include screening for:  Breast cancer.  Cervical cancer.  Colorectal cancer.  Skin cancer.  Lung cancer. What should I know about heart disease, diabetes, and  high blood pressure? Blood pressure and heart disease  High blood pressure causes heart disease and increases the risk of stroke. This is more likely to develop in people who have high blood pressure readings, are of African descent, or are overweight.  Have your blood pressure checked: ? Every 3-5 years if you are 29-39 years of age. ? Every year if you are 97 years old or older. Diabetes Have regular diabetes screenings. This checks your fasting blood sugar level. Have the screening done:  Once every three years after age 94 if you are at a normal weight and have a low risk for diabetes.  More often and at a younger age if you are overweight or have a high risk for diabetes. What should I know about preventing infection? Hepatitis B If you have a higher risk for hepatitis B, you should be screened for this virus. Talk with your health care provider to find out if you are at risk for hepatitis B infection. Hepatitis C Testing is recommended for:  Everyone born from [birth year] through 1967.  Anyone with known risk factors for hepatitis C. Sexually transmitted infections (STIs)  Get screened for STIs, including gonorrhea and chlamydia, if: ? You are sexually active and are younger than 46 years of age. ? You are older than 46 years of age and your health care provider tells you that you are at risk for this type of infection. ? Your sexual activity has changed since you were last screened, and you are at increased risk for chlamydia or gonorrhea. Ask your health care provider if you are at risk.  Ask your health care provider about whether you are at high risk for HIV. Your health care provider may recommend a prescription medicine to help prevent HIV infection. If you choose to take medicine to prevent HIV, you should first get tested for HIV. You should then be tested every 3 months for as long as you are taking the medicine. Pregnancy  If you are about to stop having your period  (premenopausal) and you may become pregnant, seek counseling before you get pregnant.  Take 400 to 800 micrograms (mcg) of folic acid every day if you become pregnant.  Ask for birth control (contraception) if you want to prevent pregnancy. Osteoporosis and menopause Osteoporosis is a disease in which the bones lose minerals and strength with aging. This can result in bone fractures. If you are 48 years old or older, or if you are at risk for osteoporosis and fractures, ask your health care provider if you should:  Be screened for bone loss.  Take a calcium or vitamin D supplement to lower your risk of fractures.  Be given hormone replacement therapy (HRT) to treat symptoms of menopause. Follow these instructions at home: Lifestyle  Do not use any  products that contain nicotine or tobacco, such as cigarettes, e-cigarettes, and chewing tobacco. If you need help quitting, ask your health care provider.  Do not use street drugs.  Do not share needles.  Ask your health care provider for help if you need support or information about quitting drugs. Alcohol use  Do not drink alcohol if: ? Your health care provider tells you not to drink. ? You are pregnant, may be pregnant, or are planning to become pregnant.  If you drink alcohol: ? Limit how much you use to 0-1 drink a day. ? Limit intake if you are breastfeeding.  Be aware of how much alcohol is in your drink. In the [place] one drink equals one 12 oz bottle of beer (355 mL), one 5 oz glass of wine (148 mL), or one 1 oz glass of hard liquor (44 mL). General instructions  Schedule regular health, dental, and eye exams.  Stay current with your vaccines.  Tell your health care provider if: ? You often feel depressed. ? You have ever been abused or do not feel safe at home. Summary  Adopting a healthy lifestyle and getting preventive care are important in promoting health and wellness.  Follow your health care provider's  instructions about healthy diet, exercising, and getting tested or screened for diseases.  Follow your health care provider's instructions on monitoring your cholesterol and blood pressure. This information is not intended to replace advice given to you by your health care provider. Make sure you discuss any questions you have with your health care provider. Document Revised: 03/06/2018 Document Reviewed: 03/06/2018 Elsevier Patient Education  2020 [employer].

## 2020-03-10 DIAGNOSIS — R7989 Other specified abnormal findings of blood chemistry: Secondary | ICD-10-CM | POA: Diagnosis not present

## 2020-03-10 DIAGNOSIS — E0590 Thyrotoxicosis, unspecified without thyrotoxic crisis or storm: Secondary | ICD-10-CM | POA: Diagnosis not present

## 2020-03-10 DIAGNOSIS — Z1159 Encounter for screening for other viral diseases: Secondary | ICD-10-CM | POA: Diagnosis not present

## 2020-03-10 DIAGNOSIS — R7301 Impaired fasting glucose: Secondary | ICD-10-CM | POA: Diagnosis not present

## 2020-03-10 DIAGNOSIS — Z131 Encounter for screening for diabetes mellitus: Secondary | ICD-10-CM | POA: Diagnosis not present

## 2020-03-10 DIAGNOSIS — E559 Vitamin D deficiency, unspecified: Secondary | ICD-10-CM | POA: Diagnosis not present

## 2020-03-11 LAB — COMPLETE METABOLIC PANEL WITH GFR
AG Ratio: 1.8 (calc) (ref 1.0–2.5)
ALT: 13 U/L (ref 6–29)
AST: 12 U/L (ref 10–35)
Albumin: 4.1 g/dL (ref 3.6–5.1)
Alkaline phosphatase (APISO): 73 U/L (ref 31–125)
BUN: 16 mg/dL (ref 7–25)
CO2: 29 mmol/L (ref 20–32)
Calcium: 9.3 mg/dL (ref 8.6–10.2)
Chloride: 105 mmol/L (ref 98–110)
Creat: 0.53 mg/dL (ref 0.50–1.10)
GFR, Est African American: 132 mL/min/{1.73_m2} (ref >=60)
GFR, Est Non African American: 114 mL/min/{1.73_m2} (ref >=60)
Globulin: 2.3 g/dL (calc) (ref 1.9–3.7)
Glucose, Bld: 94 mg/dL (ref 65–99)
Potassium: 4.8 mmol/L (ref 3.5–5.3)
Sodium: 140 mmol/L (ref 135–146)
Total Bilirubin: 0.3 mg/dL (ref 0.2–1.2)
Total Protein: 6.4 g/dL (ref 6.1–8.1)

## 2020-03-11 LAB — CBC WITH DIFFERENTIAL/PLATELET
Absolute Monocytes: 666 cells/uL (ref 200–950)
Basophils Absolute: 42 cells/uL (ref 0–200)
Basophils Relative: 0.4 %
Eosinophils Absolute: 198 cells/uL (ref 15–500)
Eosinophils Relative: 1.9 %
HCT: 41.6 % (ref 35.0–45.0)
Hemoglobin: 14.3 g/dL (ref 11.7–15.5)
Lymphs Abs: 2278 cells/uL (ref 850–3900)
MCH: 31.3 pg (ref 27.0–33.0)
MCHC: 34.4 g/dL (ref 32.0–36.0)
MCV: 91 fL (ref 80.0–100.0)
MPV: 10.3 fL (ref 7.5–12.5)
Monocytes Relative: 6.4 %
Neutro Abs: 7218 cells/uL (ref 1500–7800)
Neutrophils Relative %: 69.4 %
Platelets: 301 10*3/uL (ref 140–400)
RBC: 4.57 10*6/uL (ref 3.80–5.10)
RDW: 12.4 % (ref 11.0–15.0)
Total Lymphocyte: 21.9 %
WBC: 10.4 10*3/uL (ref 3.8–10.8)

## 2020-03-11 LAB — LIPID PANEL W/REFLEX DIRECT LDL
Cholesterol: 155 mg/dL (ref <200)
HDL: 53 mg/dL (ref >=50)
LDL Cholesterol (Calc): 86 mg/dL (calc)
Non-HDL Cholesterol (Calc): 102 mg/dL (calc) (ref <130)
Total CHOL/HDL Ratio: 2.9 (calc) (ref <5.0)
Triglycerides: 71 mg/dL (ref <150)

## 2020-03-11 LAB — HEMOGLOBIN A1C
Hgb A1c MFr Bld: 5.7 % of total Hgb — ABNORMAL HIGH (ref <5.7)
Mean Plasma Glucose: 117 mg/dL
eAG (mmol/L): 6.5 mmol/L

## 2020-03-11 LAB — HEPATITIS C ANTIBODY
Hepatitis C Ab: NONREACTIVE
SIGNAL TO CUT-OFF: 0.01 (ref <1.00)

## 2020-03-11 LAB — VITAMIN D 25 HYDROXY (VIT D DEFICIENCY, FRACTURES): Vit D, 25-Hydroxy: 37 ng/mL (ref 30–100)

## 2020-03-11 LAB — TSH: TSH: 0.52 mIU/L

## 2020-03-12 ENCOUNTER — Encounter: Payer: Self-pay | Admitting: Physician Assistant

## 2020-03-12 DIAGNOSIS — E041 Nontoxic single thyroid nodule: Secondary | ICD-10-CM | POA: Insufficient documentation

## 2020-03-12 DIAGNOSIS — H9193 Unspecified hearing loss, bilateral: Secondary | ICD-10-CM | POA: Insufficient documentation

## 2020-03-12 NOTE — Progress Notes (Signed)
Lauren Bowers,   Labs look good overall.  Cholesterol GREAT.  A1C is pre-diabetes range. Weight loss and diet changes can help this.  Thyroid great.

## 2020-03-12 NOTE — Progress Notes (Signed)
Subjective:     Lauren Bowers is a 44 y.o. female and is here for a comprehensive physical exam. The patient reports problems - pt has been having some ringing in her ears and hearing loss for the past few months. .  Social History   Socioeconomic History  . Marital status: Married    Spouse name: Not on file  . Number of children: Not on file  . Years of education: Not on file  . Highest education level: Not on file  Occupational History  . Occupation: cUSTOMER SERVICE [employer]  Tobacco Use  . Smoking status: Current Every Day Smoker    Packs/day: 0.50    Years: 20.00    Pack years: 10.00    Types: Cigarettes    Start date: 10/23/1992  . Smokeless tobacco: Never Used  Vaping Use  . Vaping Use: Never used  Substance and Sexual Activity  . Alcohol use: Yes  . Drug use: No  . Sexual activity: Yes    Birth control/protection: Pill  Other Topics Concern  . Not on file  Social History Narrative   Marital status: married      Child: one      Lives: with husband, child.      Employment:  Call center for [employer].         Social Determinants of Health   Financial Resource Strain: Not on file  Food Insecurity: Not on file  Transportation Needs: Not on file  Physical Activity: Not on file  Stress: Not on file  Social Connections: Not on file  Intimate Partner Violence: Not on file   Health Maintenance  Topic Date Due  . COVID-19 Vaccine (1) 03/25/2020 (Originally [birth year]24/1980)  . INFLUENZA VACCINE  06/24/2020 (Originally 10/26/2019)  . TETANUS/TDAP  03/09/2021 (Originally 10/23/1992)  . MAMMOGRAM  07/02/2020  . PAP SMEAR-Modifier  06/19/2022  . Hepatitis C Screening  Completed  . HIV Screening  Completed    The following portions of the patient's history were reviewed and updated as appropriate: allergies, current medications, past family history, past medical history, past social history, past surgical history and problem list.  Review of Systems A comprehensive review of  systems was negative.   Objective:    BP (!) 100/50   Pulse 84   Ht 5\' 6"  (1.676 m)   Wt 196 lb (88.9 kg)   SpO2 96%   BMI 31.64 kg/m  General appearance: alert, cooperative and appears stated age Head: Normocephalic, without obvious abnormality, atraumatic Eyes: conjunctivae/corneas clear. PERRL, EOM's intact. Fundi benign. Ears: normal TM's and external ear canals both ears Nose: Nares normal. Septum midline. Mucosa normal. No drainage or sinus tenderness. Throat: lips, mucosa, and tongue normal; teeth and gums normal Neck: no adenopathy, no carotid bruit, no JVD, supple, symmetrical, trachea midline and left 1cm firm nodule left thyroid,non tender. Back: symmetric, no curvature. ROM normal. No CVA tenderness. Lungs: clear to auscultation bilaterally Heart: regular rate and rhythm, S1, S2 normal, no murmur, click, rub or gallop Abdomen: soft, non-tender; bowel sounds normal; no masses,  no organomegaly Extremities: extremities normal, atraumatic, no cyanosis or edema Pulses: 2+ and symmetric Skin: Skin color, texture, turgor normal. No rashes or lesions Lymph nodes: Cervical, supraclavicular, and axillary nodes normal. Neurologic: Alert and oriented X 3, normal strength and tone. Normal symmetric reflexes. Normal coordination and gait    Assessment:    Healthy female exam.      Plan:  [name] was seen today for annual exam.  Diagnoses and all orders for this visit:  Routine adult health maintenance -     CBC w/Diff/Platelet  Low TSH level -     TSH  Elevated fasting glucose -     HgB A1c -     COMPLETE METABOLIC PANEL WITH GFR  Class 1 obesity due to excess calories without serious comorbidity with body mass index (BMI) of 34.0 to 34.9 in adult -     HgB A1c -     COMPLETE METABOLIC PANEL WITH GFR  Screening for lipid disorders -     Lipid Panel w/reflex Direct LDL  Screening for diabetes mellitus -     HgB A1c -     COMPLETE METABOLIC PANEL WITH  GFR  Thyroid disorder screening -     TSH  Family history of ovarian cancer -     norethindrone (MICRONOR) 0.35 MG tablet; Take 1 tablet (0.35 mg total) by mouth daily.  Unwanted fertility -     norethindrone (MICRONOR) 0.35 MG tablet; Take 1 tablet (0.35 mg total) by mouth daily.  Bilateral hearing loss, unspecified hearing loss type  Tinnitus of both ears -     fluticasone (FLONASE) 50 MCG/ACT nasal spray; Place 2 sprays into both nostrils daily.  Left thyroid nodule -     US THYROID  Colon cancer screening -     Ambulatory referral to Gastroenterology  Encounter for hepatitis C screening test for low risk patient -     Hepatitis C Antibody  Vitamin D deficiency -     VITAMIN D 25 Hydroxy (Vit-D Deficiency, Fractures)  .Marland Kitchen Discussed 150 minutes of exercise a week.  Encouraged vitamin D 1000 units and Calcium 1300mg  or 4 servings of dairy a day.  Fasting labs ordered. Pap UTD.  Mammogram UTD.  Colonoscopy referral made.  Flu/covid/tdap vaccine declined.   Tinnitus-gross hearing effected. Overall hearing exam 40 or below on all hertz. HO given. Start Flonase. Follow up for dedicated exam.  Nodule on thyroid left side- TSH ordered as well as U/S.      See After Visit Summary for Counseling Recommendations

## 2020-04-07 ENCOUNTER — Telehealth (INDEPENDENT_AMBULATORY_CARE_PROVIDER_SITE_OTHER): Payer: BC Managed Care – PPO | Admitting: Physician Assistant

## 2020-04-07 ENCOUNTER — Encounter: Payer: Self-pay | Admitting: Physician Assistant

## 2020-04-07 DIAGNOSIS — Z7185 Encounter for immunization safety counseling: Secondary | ICD-10-CM | POA: Diagnosis not present

## 2020-04-07 NOTE — Progress Notes (Signed)
Pt stated she wanted to talk about the covid vaccine she had no other concerns.

## 2020-04-07 NOTE — Progress Notes (Signed)
Patient ID: Lauren Bowers, female   DOB: 21-Oct-1975, 45 y.o.   MRN: [id number] .Marland KitchenVirtual Visit via Telephone Note  I connected with Lauren Bowers on 04/07/20 at  8:30 AM EST by telephone and verified that I am speaking with the correct person using two identifiers.  Location: Patient: work Provider: clinic  .Marland KitchenParticipating in visit:  Patient: Cassy Sprowl Provider: Iran Planas PA-C   I discussed the limitations, risks, security and privacy concerns of performing an evaluation and management service by telephone and the availability of in person appointments. I also discussed with the patient that there may be a patient responsible charge related to this service. The patient expressed understanding and agreed to proceed.   History of Present Illness: Pt would like to discuss vaccine.    .. Active Ambulatory Problems    Diagnosis Date Noted  . Depression 02/06/2014  . Anxiety disorder 02/06/2014  . Family planning counseling 02/06/2014  . Early menopause occurring in patient age younger than 65 years 03/12/2018  . Class 1 obesity due to excess calories without serious comorbidity with body mass index (BMI) of 34.0 to 34.9 in adult 03/12/2018  . Family history of Charcot-Marie-Tooth disease 03/12/2018  . Neuropathy 03/12/2018  . Elevated fasting glucose 03/15/2018  . Low TSH level 03/15/2018  . Depressed mood 06/26/2018  . 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 Tinnitus of both ears 03/09/2020  . Bilateral hearing loss 03/12/2020  . Left thyroid nodule 03/12/2020   Resolved Ambulatory Problems    Diagnosis Date Noted  . Conjunctivitis 02/09/2014   Past Medical History:  Diagnosis Date  . Allergy   . Anxiety   . History of thyroid nodule   . Vaginal Pap smear, abnormal   . Vitamin D deficiency    Reviewed med, allergies, problem list.     Observations/Objective: No acute distress Anxious about vaccine   Assessment and Plan: Marland KitchenMarland KitchenDiagnoses and all orders for  this visit:  Vaccine counseling   Spent 30 minutes with patient discussing all 3 FDA approved vaccines, side effects, risk and benefits. All questions answered and patient was encouraged to get the J and J vaccine based on her concerns.    Follow Up Instructions:    I discussed the assessment and treatment plan with the patient. The patient was provided an opportunity to ask questions and all were answered. The patient agreed with the plan and demonstrated an understanding of the instructions.   The patient was advised to call back or seek an in-person evaluation if the symptoms worsen or if the condition fails to improve as anticipated.  I provided  30 minutes of non-face-to-face time during this encounter.   Iran Planas, PA-C

## 2020-04-12 ENCOUNTER — Encounter: Payer: BC Managed Care – PPO | Admitting: Sports Medicine

## 2020-04-14 ENCOUNTER — Other Ambulatory Visit: Payer: BC Managed Care – PPO

## 2020-04-21 ENCOUNTER — Encounter: Payer: Self-pay | Admitting: Physician Assistant

## 2020-04-21 DIAGNOSIS — Z20822 Contact with and (suspected) exposure to covid-19: Secondary | ICD-10-CM

## 2020-04-21 DIAGNOSIS — Z0184 Encounter for antibody response examination: Secondary | ICD-10-CM

## 2020-04-23 DIAGNOSIS — Z20822 Contact with and (suspected) exposure to covid-19: Secondary | ICD-10-CM | POA: Diagnosis not present

## 2020-04-23 DIAGNOSIS — Z0184 Encounter for antibody response examination: Secondary | ICD-10-CM | POA: Diagnosis not present

## 2020-04-28 LAB — SARS-COV-2 SEMI-QUANTITATIVE TOTAL ANTIBODY, SPIKE: SARS COV2 AB, Total Spike Semi QN: <0.4 U/mL (ref <0.8)

## 2020-04-28 NOTE — Telephone Encounter (Signed)
You do not have antibodies to covid.

## 2020-04-30 ENCOUNTER — Other Ambulatory Visit: Payer: Self-pay

## 2020-04-30 ENCOUNTER — Ambulatory Visit (INDEPENDENT_AMBULATORY_CARE_PROVIDER_SITE_OTHER): Payer: BC Managed Care – PPO | Admitting: Sports Medicine

## 2020-04-30 ENCOUNTER — Ambulatory Visit (INDEPENDENT_AMBULATORY_CARE_PROVIDER_SITE_OTHER): Payer: BC Managed Care – PPO

## 2020-04-30 ENCOUNTER — Encounter: Payer: Self-pay | Admitting: Sports Medicine

## 2020-04-30 DIAGNOSIS — M2342 Loose body in knee, left knee: Secondary | ICD-10-CM

## 2020-04-30 DIAGNOSIS — M1712 Unilateral primary osteoarthritis, left knee: Secondary | ICD-10-CM | POA: Diagnosis not present

## 2020-04-30 DIAGNOSIS — M25461 Effusion, right knee: Secondary | ICD-10-CM

## 2020-04-30 DIAGNOSIS — M1711 Unilateral primary osteoarthritis, right knee: Secondary | ICD-10-CM | POA: Diagnosis not present

## 2020-04-30 NOTE — Progress Notes (Signed)
    Procedures performed today:    Procedure: Real-time Ultrasound Guided aspiration/injection of right knee Device: Samsung HS60  Verbal informed consent obtained.  Time-out conducted.  Noted no overlying erythema, induration, or other signs of local infection.  Skin prepped in a sterile fashion.  Local anesthesia: Topical Ethyl chloride.  With sterile technique and under real time ultrasound guidance:  I advanced an 18-gauge needle into an obviously effused knee, aspirated approximately 30 mL of clear, straw-colored fluid, syringe switched and 1 cc Kenalog 40, 2 cc lidocaine, 2 cc bupivacaine injected easily Completed without difficulty  Advised to call if fevers/chills, erythema, induration, drainage, or persistent bleeding.  Images permanently stored and available for review in PACS.  Impression: Technically successful ultrasound guided injection.  Independent interpretation of notes and tests performed by another provider:   None.  Brief History, Exam, Impression, and Recommendations:    Effusion of right knee joint This is a pleasant 45 year old female, she has a family history of Charcot-Marie-Tooth syndrome in her brother and father, she has had the genetic testing and tells me she had nerve conduction and EMG testing consistent with Charcot-Marie-Tooth, likely she is not yet really expressing many phenotypic symptoms of Charcot-Marie-Tooth and has good muscle mass in her lower legs. Unfortunately she had a fall, about a year ago, and since then has had pain in her knee, right-sided with significant swelling. Today we did an aspiration and injection of her knee, I will get some x-rays, formal physical therapy, like her to wear a reaction knee brace and return to see me in a month. I would also like her to get a convertible workstation for use at home.    ___________________________________________ Gwen Her. Dianah Field, M.D., ABFM., CAQSM. Primary Care and [place] Instructor of [place] [employer]

## 2020-04-30 NOTE — Assessment & Plan Note (Addendum)
This is a pleasant 45 year old female, she has a family history of Charcot-Marie-Tooth syndrome in her brother and father, she has had the genetic testing and tells me she had nerve conduction and EMG testing consistent with Charcot-Marie-Tooth, likely she is not yet really expressing many phenotypic symptoms of Charcot-Marie-Tooth and has good muscle mass in her lower legs. Unfortunately she had a fall, about a year ago, and since then has had pain in her knee, right-sided with significant swelling. Today we did an aspiration and injection of her knee, I will get some x-rays, formal physical therapy, like her to wear a reaction knee brace and return to see me in a month. I would also like her to get a convertible workstation for use at home.

## 2020-05-21 ENCOUNTER — Encounter: Payer: Self-pay | Admitting: Physical Therapy

## 2020-05-21 ENCOUNTER — Ambulatory Visit (INDEPENDENT_AMBULATORY_CARE_PROVIDER_SITE_OTHER): Payer: BC Managed Care – PPO | Admitting: Physical Therapy

## 2020-05-21 ENCOUNTER — Other Ambulatory Visit: Payer: Self-pay

## 2020-05-21 DIAGNOSIS — M25561 Pain in right knee: Secondary | ICD-10-CM | POA: Diagnosis not present

## 2020-05-21 DIAGNOSIS — R29898 Other symptoms and signs involving the musculoskeletal system: Secondary | ICD-10-CM | POA: Diagnosis not present

## 2020-05-21 DIAGNOSIS — R600 Localized edema: Secondary | ICD-10-CM

## 2020-05-21 NOTE — Patient Instructions (Signed)
Access Code: [email or web address] URL: [email or web address] Date: 05/21/2020 Prepared by: Isabelle Course  Exercises Squat with Chair Touch and Resistance Loop - 1 x daily - 7 x weekly - 3 sets - 10 reps Lateral Step Down - 1 x daily - 7 x weekly - 3 sets - 10 reps Forward Step Down - 1 x daily - 7 x weekly - 3 sets - 10 reps Roller Massage Elongated IT Band Release - 1 x daily - 7 x weekly - 3 sets - 10 reps

## 2020-05-21 NOTE — Therapy (Signed)
[place], [place], [address] Phone: [phone]   Fax:  [phone]  Physical Therapy Evaluation  Patient Details  Name: Lauren Bowers MRN: [id number] Date of Birth: March 01, 1976 Referring Provider (PT): Iran Planas   Encounter Date: 05/21/2020   PT End of Session - 05/21/20 1638    Visit Number 1    Number of Visits 6    Date for PT Re-Evaluation 07/01/20    PT Start Time 0800    PT Stop Time 0845    PT Time Calculation (min) 45 min    Activity Tolerance Patient tolerated treatment well    Behavior During Therapy [facility] for tasks assessed/performed           Past Medical History:  Diagnosis Date  . Allergy   . Anxiety   . Depression   . History of thyroid nodule   . Vaginal Pap smear, abnormal   . Vitamin D deficiency     Past Surgical History:  Procedure Laterality Date  . CHOLECYSTECTOMY      There were no vitals filed for this visit.    Subjective Assessment - 05/21/20 0806    Subjective Pt started working out about a year ago and has had swelling in Rt knee since then. Swelling worsened in December and pt saw MD. MD withdrew fluid from knee and issued a knee brace. 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 Yes    Pain Score 2     Pain Location Knee    Pain Orientation Right    Pain Descriptors / Indicators Aching    Pain Type Chronic pain    Pain Onset More than a month ago    Pain Frequency Intermittent    Aggravating Factors  elliptical, some stretches    Pain Relieving Factors rest    Effect of Pain on Daily Activities modified exercise program              [facility] PT Assessment - 05/21/20 0001      Assessment   Medical Diagnosis effusion of Rt knee joint    Referring  Provider (PT) Alden Hipp, Jade      Balance Screen   Has the patient fallen in the past 6 months No      [place] residence    Home Access Stairs to enter    Entrance Stairs-Number of Steps 2nd floor apartment      Prior Function   Level of Independence Independent      Observation/Other Assessments   Focus on Therapeutic Outcomes (FOTO)  53 functional status measure      Observation/Other Assessments-Edema    Edema --   edema Rt knee     ROM / Strength   AROM / PROM / Strength AROM;Strength      AROM   AROM Assessment Site Knee    Right/Left Knee Right;Left    Right Knee Extension 0    Right Knee Flexion 124    Left Knee Flexion 132      Strength   Overall Strength Comments hip strength 4+/5 bilat    Strength Assessment Site Knee    Right/Left Knee Right;Left    Right Knee Flexion 4/5  Right Knee Extension 4+/5    Left Knee Flexion 4+/5    Left Knee Extension 4+/5      Flexibility   Soft Tissue Assessment /Muscle Length yes    Hamstrings WFL    ITB decreased      Palpation   Patella mobility crepitus with patellar mobs    Palpation comment ITB multiple trigger points      Special Tests    Special Tests Knee Special Tests    Other special tests anterior drawer/posterior drawer negative    Knee Special tests  Step-up/Step Down Test      Step-up/Step Down    Findings Positive    Side  Right      Ambulation/Gait   Stairs Yes    Stair Management Technique --   focus and cues for eccentric control                     Objective measurements completed on examination: See above findings.       [facility] Adult PT Treatment/Exercise - 05/21/20 0001      Exercises   Exercises Knee/Hip      Knee/Hip Exercises: Standing   Lateral Step Up Right;2 sets;10 reps;Hand Hold: 1;Step Height: 4"    Step Down Right;2 sets;10 reps;Hand Hold: 1;Step Height: 4"    Functional Squat 2 sets;10 reps    Functional Squat  Limitations with red TB around knees. to mat table. cues for form      Modalities   Modalities Vasopneumatic      Vasopneumatic   Number Minutes Vasopneumatic  10 minutes    Vasopnuematic Location  Knee    Vasopneumatic Pressure Low    Vasopneumatic Temperature  34                  PT Education - 05/21/20 6213    Education Details PT POC and goals, HEP    Person(s) Educated Patient    Methods Demonstration;Handout;Explanation    Comprehension Verbalized understanding;Returned demonstration               PT Long Term Goals - 05/21/20 0957      PT LONG TERM GOAL #1   Title Pt will be independent in HEP    Time 6    Period Weeks    Status New    Target Date 07/01/20      PT LONG TERM GOAL #2   Title Pt will improve FOTO to >= 74 to demo improved functional mobility    Time 6    Period Weeks    Status New    Target Date 07/01/20      PT LONG TERM GOAL #3   Title Pt will demo proper form with squats and stepping down to return to gym program with decreased pain    Time 6    Period Weeks    Status New    Target Date 07/01/20                  Plan - 05/21/20 0953    Clinical Impression Statement Pt presents with increased Rt knee pain and swelling, impaired balance and body mechanics and decreased ability to perform recreation activities. Pt will benefit from skilled PT to address deficits and improve functional mobility    Personal Factors and Comorbidities Comorbidity 1;Time since onset of injury/illness/exacerbation    Examination-Activity Limitations Stairs;Squat    Examination-Participation Restrictions Driving;Yard Work    Stability/Clinical Decision Making Stable/Uncomplicated  Clinical Decision Making Low    Rehab Potential Good    PT Frequency 1x / week    PT Duration 6 weeks    PT Treatment/Interventions Vasopneumatic Device;Dry needling;Taping;Manual techniques;Neuromuscular re-education;Balance training;Therapeutic  exercise;Therapeutic activities;Gait training;Stair training;Cryotherapy;Moist Heat;Aquatic Therapy;Iontophoresis 4mg /ml Dexamethasone;Electrical Stimulation    PT Next Visit Plan assess HEP. activity modification for home gym program. assess balance (SLS)    PT Home Exercise Plan Access Code: [email or web address]    Consulted and Agree with Plan of Care Patient           Patient will benefit from skilled therapeutic intervention in order to improve the following deficits and impairments:  Pain,Decreased strength,Decreased activity tolerance,Increased edema,Decreased balance  Visit Diagnosis: Acute pain of right knee - Plan: PT plan of care cert/re-cert  Localized edema - Plan: PT plan of care cert/re-cert  Other symptoms and signs involving the musculoskeletal system - Plan: PT plan of care cert/re-cert     Problem List Patient Active Problem List   Diagnosis Date Noted  . Effusion of right knee joint 04/30/2020  . Bilateral hearing loss 03/12/2020  . Left thyroid nodule 03/12/2020  . Tinnitus of both ears 03/09/2020  . Depressed mood 06/26/2018  . 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 Elevated fasting glucose 03/15/2018  . Low TSH level 03/15/2018  . Early menopause occurring in patient age younger than 23 years 03/12/2018  . Class 1 obesity due to excess calories without serious comorbidity with body mass index (BMI) of 34.0 to 34.9 in adult 03/12/2018  . Family history of Charcot-Marie-Tooth disease 03/12/2018  . Neuropathy 03/12/2018  . Depression 02/06/2014  . Anxiety disorder 02/06/2014  . Family planning counseling 02/06/2014   Isabelle Course, PT  Breanne Olvera 05/21/2020, 10:07 AM  [facility] [place], [place], [address] Phone: [phone]   Fax:  [phone]  Name: ANDILYN BETTCHER MRN: [id number] Date of Birth: 02-Sep-1975

## 2020-05-25 ENCOUNTER — Ambulatory Visit (INDEPENDENT_AMBULATORY_CARE_PROVIDER_SITE_OTHER): Payer: BC Managed Care – PPO | Admitting: Physical Therapy

## 2020-05-25 ENCOUNTER — Other Ambulatory Visit: Payer: Self-pay

## 2020-05-25 ENCOUNTER — Encounter: Payer: Self-pay | Admitting: Physical Therapy

## 2020-05-25 DIAGNOSIS — R29898 Other symptoms and signs involving the musculoskeletal system: Secondary | ICD-10-CM | POA: Diagnosis not present

## 2020-05-25 DIAGNOSIS — M25561 Pain in right knee: Secondary | ICD-10-CM | POA: Diagnosis not present

## 2020-05-25 DIAGNOSIS — R600 Localized edema: Secondary | ICD-10-CM

## 2020-05-25 NOTE — Patient Instructions (Addendum)
Kinesiology tape What is kinesiology tape?  There are many brands of kinesiology tape.  KTape, [employer], [employer], Dynamic tape, to name a few. It is an elasticized tape designed to support the body's natural healing process. This tape provides stability and support to muscles and joints without restricting motion. It can also help decrease swelling in the area of application. How does it work? The tape microscopically lifts and decompresses the skin to allow for drainage of lymph (swelling) to flow away from area, reducing inflammation.  The tape has the ability to help re-educate the neuromuscular system by targeting specific receptors in the skin.  The presence of the tape increases the body's awareness of posture and body mechanics.  Do not use with: . Open wounds . Skin lesions . Adhesive allergies Safe removal of the tape: In some rare cases, mild/moderate skin irritation can occur.  This can include redness, itchiness, or hives. If this occurs, immediately remove tape and consult your primary care physician if symptoms are severe or do not resolve within 2 days.  To remove tape safely, hold nearby skin with one hand and gentle roll tape down with other hand.  You can apply oil or conditioner to tape while in shower prior to removal to loosen adhesive.  DO NOT swiftly rip tape off like a band-aid, as this could cause skin tears and additional skin irritation.   Trigger Point Dry Needling  . What is Trigger Point Dry Needling (DN)? o DN is a physical therapy technique used to treat muscle pain and dysfunction. Specifically, DN helps deactivate muscle trigger points (muscle knots).  o A thin filiform needle is used to penetrate the skin and stimulate the underlying trigger point. The goal is for a local twitch response (LTR) to occur and for the trigger point to relax. No medication of any kind is injected during the procedure.   . What Does Trigger Point Dry Needling Feel Like?  o The  procedure feels different for each individual patient. Some patients report that they do not actually feel the needle enter the skin and overall the process is not painful. Very mild bleeding may occur. However, many patients feel a deep cramping in the muscle in which the needle was inserted. This is the local twitch response.   Marland Kitchen How Will I feel after the treatment? o Soreness is normal, and the onset of soreness may not occur for a few hours. Typically this soreness does not last longer than two days.  o Bruising is uncommon, however; ice can be used to decrease any possible bruising.  o In rare cases feeling tired or nauseous after the treatment is normal. In addition, your symptoms may get worse before they get better, this period will typically not last longer than 24 hours.   . What Can I do After My Treatment? o Increase your hydration by drinking more water for the next 24 hours. o You may place ice or heat on the areas treated that have become sore, however, do not use heat on inflamed or bruised areas. Heat often brings more relief post needling. o You can continue your regular activities, but vigorous activity is not recommended initially after the treatment for 24 hours. o DN is best combined with other physical therapy such as strengthening, stretching, and other therapies.   Access Code: [email or web address] URL: [email or web address] Date: 05/25/2020 Prepared by: Foreman with Chair Touch - 1 x daily - 3 x weekly -  2 sets - 10 reps Lateral Step Down - 1 x daily - 3 x weekly - 2 sets - 10 reps Forward Step Down - 1 x daily - 3 x weekly - 2 sets - 10 reps Roller Massage Elongated IT Band Release - 1 x daily - 7 x weekly Hooklying Hamstring Stretch with Strap - 1-2 x daily - 7 x weekly - 1 sets - 2-3 reps - 20-30 seconds hold Supine ITB Stretch with Strap - 1-2 x daily - 7 x weekly - 1 sets - 2-3 reps - 20-30 seconds hold Prone  Quadriceps Stretch with Strap - 1-2 x daily - 7 x weekly - 1 sets - 2-3 reps - 20-30 seconds hold

## 2020-05-25 NOTE — Therapy (Signed)
[place], [place], [address] Phone: [phone]   Fax:  [phone]  Physical Therapy Treatment  Patient Details  Name: Lauren Bowers MRN: [id number] Date of Birth: Nov 04, 1975 Referring Provider (PT): Iran Planas   Encounter Date: 05/25/2020   PT End of Session - 05/25/20 0802    Visit Number 2    Number of Visits 6    Date for PT Re-Evaluation 07/01/20    PT Start Time 0800    PT Stop Time 0850    PT Time Calculation (min) 50 min    Activity Tolerance Patient tolerated treatment well    Behavior During Therapy [facility] for tasks assessed/performed           Past Medical History:  Diagnosis Date  . Allergy   . Anxiety   . Depression   . History of thyroid nodule   . Vaginal Pap smear, abnormal   . Vitamin D deficiency     Past Surgical History:  Procedure Laterality Date  . CHOLECYSTECTOMY      There were no vitals filed for this visit.   Subjective Assessment - 05/25/20 0803    Subjective Pt reports that her Rt knee was irritated after eval. "It started getting sore yesterday";  possibly irritated by going up and down19 steps at home and doing laundry.    Patient Stated Goals be able to return to exercise with decreased pain and swelling    Currently in Pain? Yes    Pain Score 3     Pain Location Knee    Pain Orientation Right    Pain Descriptors / Indicators Sore    Aggravating Factors  driving with brace on, steps    Pain Relieving Factors rest, ice              [facility] PT Assessment - 05/25/20 0001      Assessment   Medical Diagnosis effusion of Rt knee joint    Referring Provider (PT) Alden Hipp, Jade      Flexibility   Soft Tissue Assessment /Muscle Length yes    Quadriceps Rt - 108 deg. Lt 118 deg             [facility] Adult PT Treatment/Exercise - 05/25/20 0001      Knee/Hip Exercises: Stretches   Passive Hamstring Stretch Right;Left;3 reps;30 seconds     Passive Hamstring Stretch Limitations hooklying x 2, seated x 1    Quad Stretch Right;5 reps;Left;2 reps;30 seconds    Quad Stretch Limitations prone x 3 (RLE), seated x 2    ITB Stretch Right;Left;2 reps;20 seconds   supine with strap     Knee/Hip Exercises: Aerobic   Recumbent Bike L1: 5 min      Knee/Hip Exercises: Standing   Functional Squat 1 set;10 reps   to pad on chair; cues for even WB.   Functional Squat Limitations no band at knee due to increased pain with pressur eof band.      Manual Therapy   Manual Therapy Soft tissue mobilization;Taping    Soft tissue mobilization IASTM to Rt distal quad to decrease fascial restriction and improve mobility.    [place] I strip of reg Rock tape applied with 20% stretch to lateral distal Rt quad, perpendicular strip applied just above patella to decompress tissue and increase proprioception  PT Education - 05/25/20 0816    Education Details HEP modified.  self massage (IASTM) instruction, info on ktape and DN.  Discussed modifying gym program (reducing resistance on bike, adding stretches)   Person(s) Educated Patient    Methods Explanation;Demonstration;Handout    Comprehension Verbalized understanding               PT Long Term Goals - 05/21/20 0957      PT LONG TERM GOAL #1   Title Pt will be independent in HEP    Time 6    Period Weeks    Status New    Target Date 07/01/20      PT LONG TERM GOAL #2   Title Pt will improve FOTO to >= 74 to demo improved functional mobility    Time 6    Period Weeks    Status New    Target Date 07/01/20      PT LONG TERM GOAL #3   Title Pt will demo proper form with squats and stepping down to return to gym program with decreased pain    Time 6    Period Weeks    Status New    Target Date 07/01/20                 Plan - 05/25/20 0905    Clinical Impression Statement Pt presents with increased  tightness in Rt lateral quad (proximal to mid); tender with IASTM.  She reported reduction of tightness in RLE with added stretches to HEP.  Some cues required from even WB with squats. Recommended pt not wear brace when driving or sitting due to increased pressure on knee structures; instead wear during WB activities for stability.  Pt will benefit from additional manual therapy/ DN to Rt LE in future session.  Goals are ongoing.    Personal Factors and Comorbidities Comorbidity 1;Time since onset of injury/illness/exacerbation    Examination-Activity Limitations Stairs;Squat    Examination-Participation Restrictions Driving;Yard Work    Stability/Clinical Decision Making Stable/Uncomplicated    Rehab Potential Good    PT Frequency 1x / week    PT Duration 6 weeks    PT Treatment/Interventions Vasopneumatic Device;Dry needling;Taping;Manual techniques;Neuromuscular re-education;Balance training;Therapeutic exercise;Therapeutic activities;Gait training;Stair training;Cryotherapy;Moist Heat;Aquatic Therapy;Iontophoresis 4mg /ml Dexamethasone;Electrical Stimulation    PT Next Visit Plan assess balance (SLS); assess response to tape - retape and teach technique if helpful.  manual therapy    PT Home Exercise Plan Access Code: [email or web address]    Consulted and Agree with Plan of Care Patient           Patient will benefit from skilled therapeutic intervention in order to improve the following deficits and impairments:  Pain,Decreased strength,Decreased activity tolerance,Increased edema,Decreased balance  Visit Diagnosis: Acute pain of right knee  Localized edema  Other symptoms and signs involving the musculoskeletal system     Problem List Patient Active Problem List   Diagnosis Date Noted  . Effusion of right knee joint 04/30/2020  . Bilateral hearing loss 03/12/2020  . Left thyroid nodule 03/12/2020  . Tinnitus of both ears 03/09/2020  . Depressed mood 06/26/2018  . CMT  (Charcot-Marie-Tooth disease) 06/26/2018  . Elevated fasting glucose 03/15/2018  . Low TSH level 03/15/2018  . Early menopause occurring in patient age younger than 75 years 03/12/2018  . Class 1 obesity due to excess calories without serious comorbidity with body mass index (BMI) of 34.0 to 34.9 in adult 03/12/2018  . Family history of Charcot-Marie-Tooth disease 03/12/2018  . Neuropathy  03/12/2018  . Depression 02/06/2014  . Anxiety disorder 02/06/2014  . Family planning counseling 02/06/2014   Kerin Perna, PTA 05/25/20 9:18 AM  [facility] [place], [place], [address] Phone: [phone]   Fax:  [phone]  Name: KAYLEAH APPLEYARD MRN: [id number] Date of Birth: [birth year]

## 2020-05-28 ENCOUNTER — Ambulatory Visit: Payer: BC Managed Care – PPO | Admitting: Sports Medicine

## 2020-06-01 ENCOUNTER — Other Ambulatory Visit: Payer: Self-pay

## 2020-06-01 ENCOUNTER — Ambulatory Visit (INDEPENDENT_AMBULATORY_CARE_PROVIDER_SITE_OTHER): Payer: BC Managed Care – PPO | Admitting: Physical Therapy

## 2020-06-01 DIAGNOSIS — R600 Localized edema: Secondary | ICD-10-CM | POA: Diagnosis not present

## 2020-06-01 DIAGNOSIS — M25561 Pain in right knee: Secondary | ICD-10-CM | POA: Diagnosis not present

## 2020-06-01 DIAGNOSIS — R29898 Other symptoms and signs involving the musculoskeletal system: Secondary | ICD-10-CM | POA: Diagnosis not present

## 2020-06-01 NOTE — Therapy (Signed)
[place], [place], [address] Phone: [phone]   Fax:  [phone]  Physical Therapy Treatment  Patient Details  Name: Lauren Bowers MRN: [id number] Date of Birth: [birth year] Referring Provider (PT): Iran Planas   Encounter Date: 06/01/2020   PT End of Session - 06/01/20 0852    Visit Number 3    Number of Visits 6    Date for PT Re-Evaluation 07/01/20    PT Start Time 0800    PT Stop Time 0845    PT Time Calculation (min) 45 min    Activity Tolerance Patient tolerated treatment well    Behavior During Therapy [facility] for tasks assessed/performed           Past Medical History:  Diagnosis Date  . Allergy   . Anxiety   . Depression   . History of thyroid nodule   . Vaginal Pap smear, abnormal   . Vitamin D deficiency     Past Surgical History:  Procedure Laterality Date  . CHOLECYSTECTOMY      There were no vitals filed for this visit.   Subjective Assessment - 06/01/20 0804    Subjective Pt reports her knee is "sore". She thinks she irritated it with the tennis ball massage. states the tape helped a lot    Currently in Pain? Yes    Pain Score 3     Pain Location Knee    Pain Orientation Right    Pain Descriptors / Indicators Sore                             [facility] Adult PT Treatment/Exercise - 06/01/20 0001      Balance   Balance Assessed Yes      Static Standing Balance   Single Leg Stance - Right Leg 4    Single Leg Stance - Left Leg 5      High Level Balance   High Level Balance Comments tandem stance 2 x 30 sec bilat      Knee/Hip Exercises: Stretches   Passive Hamstring Stretch Right;Left;2 reps;30 seconds    Passive Hamstring Stretch Limitations hooklying    Quad Stretch Right;Left;3 reps;30 seconds    Quad Stretch Limitations prone with strap    ITB Stretch Right;2 reps;30 seconds;Left      Knee/Hip Exercises: Aerobic   Nustep L5 x 5 min  for warm up      Knee/Hip Exercises: Standing   Functional Squat 2 sets;10 reps    Functional Squat Limitations to pad on chair      Manual Therapy   Manual Therapy Soft tissue mobilization    Soft tissue mobilization STM and TPR to Rt distal quad and ITB      [employer]ines[employer]ix   Create Space I strip of Rock tape with 20% stretch applied to Rt distal quad with perpendicular strip above patella to decompress tissue and increase proprioception                       PT Long Term Goals - 05/21/20 0957      PT LONG TERM GOAL #1   Title Pt will be independent in HEP    Time 6    Period Weeks    Status New    Target Date 07/01/20      PT LONG TERM GOAL #2   Title Pt will improve FOTO to >=  74 to demo improved functional mobility    Time 6    Period Weeks    Status New    Target Date 07/01/20      PT LONG TERM GOAL #3   Title Pt will demo proper form with squats and stepping down to return to gym program with decreased pain    Time 6    Period Weeks    Status New    Target Date 07/01/20                 Plan - 06/01/20 0853    Clinical Impression Statement Pt responds well to TPR to ITB. Pt has difficulty with SLS but with good performance with tandem stance. Pt with good understanding of rock tape technique and reason for use    PT Next Visit Plan continue to progress balance, manual and DN to ITB and quad as indicated    PT Home Exercise Plan Access Code: [email or web address]    Consulted and Agree with Plan of Care Patient           Patient will benefit from skilled therapeutic intervention in order to improve the following deficits and impairments:     Visit Diagnosis: Acute pain of right knee  Localized edema  Other symptoms and signs involving the musculoskeletal system     Problem List Patient Active Problem List   Diagnosis Date Noted  . Effusion of right knee joint 04/30/2020  . Bilateral hearing loss 03/12/2020  . Left thyroid nodule 03/12/2020  .  Tinnitus of both ears 03/09/2020  . Depressed mood 06/26/2018  . 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 Elevated fasting glucose 03/15/2018  . Low TSH level 03/15/2018  . Early menopause occurring in patient age younger than 47 years 03/12/2018  . Class 1 obesity due to excess calories without serious comorbidity with body mass index (BMI) of 34.0 to 34.9 in adult 03/12/2018  . Family history of Charcot-Marie-Tooth disease 03/12/2018  . Neuropathy 03/12/2018  . Depression 02/06/2014  . Anxiety disorder 02/06/2014  . Family planning counseling 02/06/2014   Isabelle Course, PT  [facility] 06/01/2020, 8:57 AM  [facility] [place], [place], [address] Phone: [phone]   Fax:  [phone]  Name: Lauren Bowers MRN: [id number] Date of Birth: Nov 30, 1975

## 2020-06-08 ENCOUNTER — Other Ambulatory Visit: Payer: Self-pay

## 2020-06-08 ENCOUNTER — Ambulatory Visit (INDEPENDENT_AMBULATORY_CARE_PROVIDER_SITE_OTHER): Payer: BC Managed Care – PPO | Admitting: Physical Therapy

## 2020-06-08 DIAGNOSIS — R600 Localized edema: Secondary | ICD-10-CM | POA: Diagnosis not present

## 2020-06-08 DIAGNOSIS — R29898 Other symptoms and signs involving the musculoskeletal system: Secondary | ICD-10-CM | POA: Diagnosis not present

## 2020-06-08 DIAGNOSIS — M25561 Pain in right knee: Secondary | ICD-10-CM | POA: Diagnosis not present

## 2020-06-08 NOTE — Therapy (Signed)
[place] [facility] [place], [place], [address] Phone: [phone]   Fax:  [phone]  Physical Therapy Treatment  Patient Details  Name: Lauren Bowers MRN: [id number] Date of Birth: 07/29/1975 Referring Provider (PT): Iran Planas   Encounter Date: 06/08/2020   PT End of Session - 06/08/20 0843    Visit Number 4    Number of Visits 6    Date for PT Re-Evaluation 07/01/20    PT Start Time 0800    PT Stop Time 0845    PT Time Calculation (min) 45 min    Activity Tolerance Patient tolerated treatment well    Behavior During Therapy [facility] for tasks assessed/performed           Past Medical History:  Diagnosis Date  . Allergy   . Anxiety   . Depression   . History of thyroid nodule   . Vaginal Pap smear, abnormal   . Vitamin D deficiency     Past Surgical History:  Procedure Laterality Date  . CHOLECYSTECTOMY      There were no vitals filed for this visit.   Subjective Assessment - 06/08/20 0805    Subjective Pt reports she still has "sporadic" knee cap pain.    Patient Stated Goals be able to return to exercise with decreased pain and swelling    Currently in Pain? Yes    Pain Score 3     Pain Location Knee    Pain Orientation Right    Pain Descriptors / Indicators Sore                             [facility] Adult PT Treatment/Exercise - 06/08/20 0001      Knee/Hip Exercises: Stretches   Passive Hamstring Stretch Right;Left;2 reps;30 seconds    Passive Hamstring Stretch Limitations hooklying with strap    Quad Stretch Right;Left;2 reps;30 seconds    Quad Stretch Limitations prone with strap    Hip Flexor Stretch Right;Left;2 reps;30 seconds    ITB Stretch Right;Left;2 reps;30 seconds      Knee/Hip Exercises: Aerobic   Recumbent Bike L3 x 5 min for warm up      Knee/Hip Exercises: Standing   Functional Squat 2 sets;10 reps    Functional Squat Limitations to pad on mat     Wall Squat 3 sets;3 seconds    Other Standing Knee Exercises sidestep with red TB around knees in partial squat 2 x 8 steps bilat      Knee/Hip Exercises: Sidelying   Hip ADduction Strengthening;Right;Left;10 reps      Manual Therapy   Soft tissue mobilization STM and TPR to Rt ITB and distal quad, foam roll to Rt ITB      [employer]   Create Space I strip of rock tape with 20% stretch applied to distal quad and perpendicular strip above patella to decompress tissue and improve proprioception                       PT Long Term Goals - 05/21/20 0957      PT LONG TERM GOAL #1   Title Pt will be independent in HEP    Time 6    Period Weeks    Status New    Target Date 07/01/20      PT LONG TERM GOAL #2   Title Pt will improve FOTO to >= 74 to demo improved  functional mobility    Time 6    Period Weeks    Status New    Target Date 07/01/20      PT LONG TERM GOAL #3   Title Pt will demo proper form with squats and stepping down to return to gym program with decreased pain    Time 6    Period Weeks    Status New    Target Date 07/01/20                 Plan - 06/08/20 0844    Clinical Impression Statement Pt continues to respond well to TPR to ITB. Pt with improved form on wall squat vs squat to mat table. Pt continues to require cuing for rock tape application    PT Next Visit Plan reassess and note for MD, progress exercises and manual as indicated    PT Home Exercise Plan Access Code: [email or web address]    Consulted and Agree with Plan of Care Patient           Patient will benefit from skilled therapeutic intervention in order to improve the following deficits and impairments:     Visit Diagnosis: Acute pain of right knee  Localized edema  Other symptoms and signs involving the musculoskeletal system     Problem List Patient Active Problem List   Diagnosis Date Noted  . Effusion of right knee joint 04/30/2020  . Bilateral hearing loss 03/12/2020   . Left thyroid nodule 03/12/2020  . Tinnitus of both ears 03/09/2020  . Depressed mood 06/26/2018  . 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 Elevated fasting glucose 03/15/2018  . Low TSH level 03/15/2018  . Early menopause occurring in patient age younger than 46 years 03/12/2018  . Class 1 obesity due to excess calories without serious comorbidity with body mass index (BMI) of 34.0 to 34.9 in adult 03/12/2018  . Family history of Charcot-Marie-Tooth disease 03/12/2018  . Neuropathy 03/12/2018  . Depression 02/06/2014  . Anxiety disorder 02/06/2014  . Family planning counseling 02/06/2014   Isabelle Course, PT  [name] 06/08/2020, 9:01 AM  [facility] [place], [place], [address] Phone: [phone]   Fax:  [phone]  Name: Lauren Bowers MRN: [id number] Date of Birth: [birth year]

## 2020-06-08 NOTE — Patient Instructions (Signed)
Access Code: [email or web address] URL: [email or web address] Date: 06/08/2020 Prepared by: Isabelle Course  Exercises Lateral Step Down - 1 x daily - 3 x weekly - 2 sets - 10 reps Forward Step Down - 1 x daily - 3 x weekly - 2 sets - 10 reps Roller Massage Elongated IT Band Release - 1 x daily - 7 x weekly Hooklying Hamstring Stretch with Strap - 1-2 x daily - 7 x weekly - 1 sets - 2-3 reps - 20-30 seconds hold Supine ITB Stretch with Strap - 1-2 x daily - 7 x weekly - 1 sets - 2-3 reps - 20-30 seconds hold Prone Quadriceps Stretch with Strap - 1-2 x daily - 7 x weekly - 1 sets - 2-3 reps - 20-30 seconds hold Sidelying Hip Adduction - 1 x daily - 7 x weekly - 2 sets - 10 reps Seated Hip Flexor Stretch - 1 x daily - 7 x weekly - 3 sets - 10 reps Wall Quarter Squat - 1 x daily - 7 x weekly - 1 sets - 10 reps - 3-5 seconds hold

## 2020-06-14 IMAGING — MG MM DIGITAL DIAGNOSTIC UNILAT*R* W/ TOMO W/ CAD
4 series · 4 of 12 positions shown · non-contrast
Comparison: Previous exam(s).

CLINICAL DATA: 43-year-old female for further evaluation of
possible RIGHT breast asymmetry on baseline screening mammogram.

EXAM:
DIGITAL DIAGNOSTIC RIGHT MAMMOGRAM WITH CAD AND TOMO
ULTRASOUND RIGHT BREAST

[R MLO synth-2D]
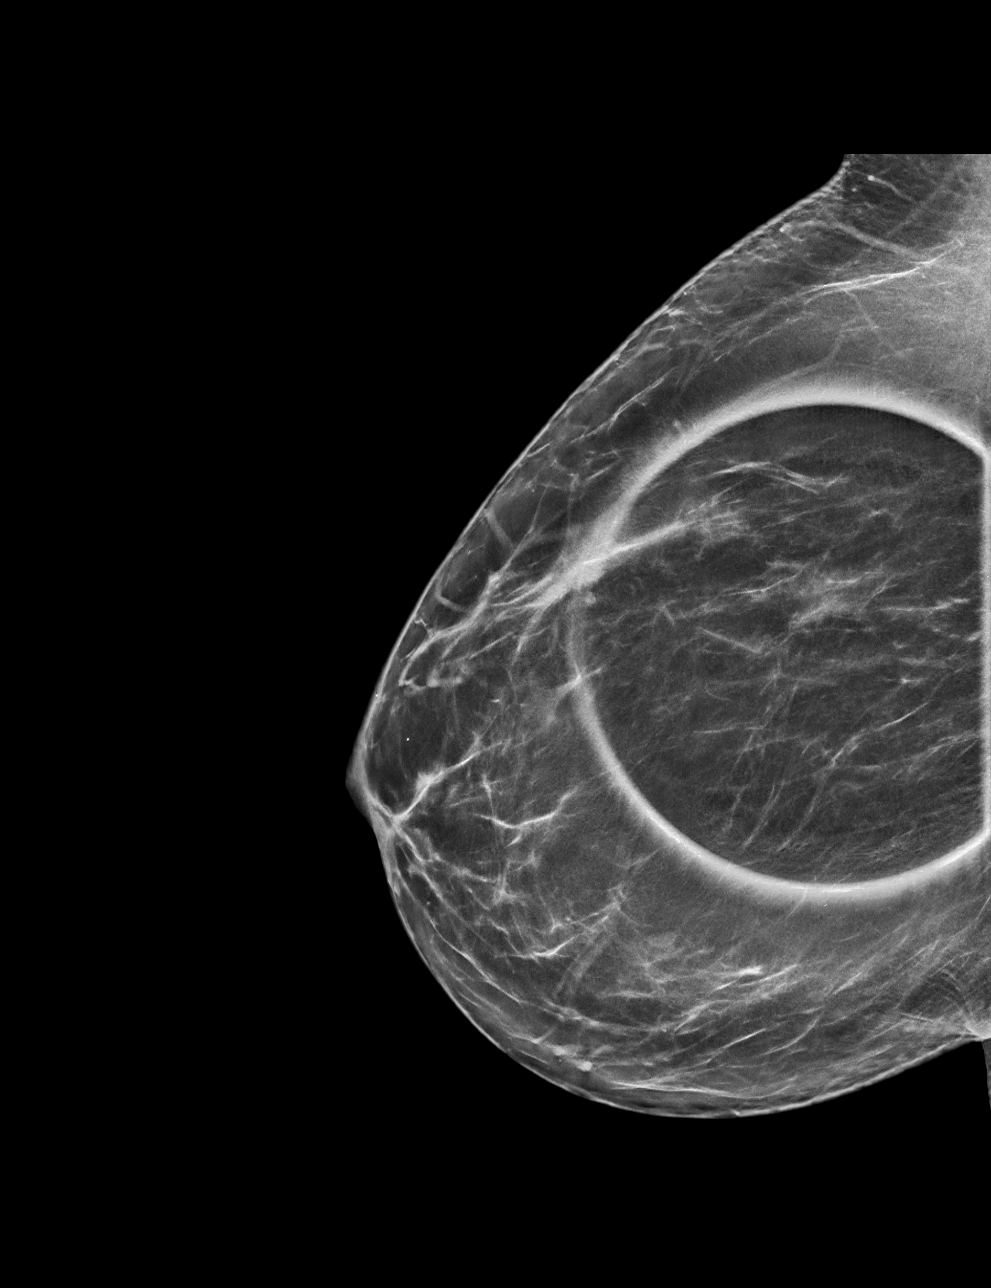

[R CC synth-2D]
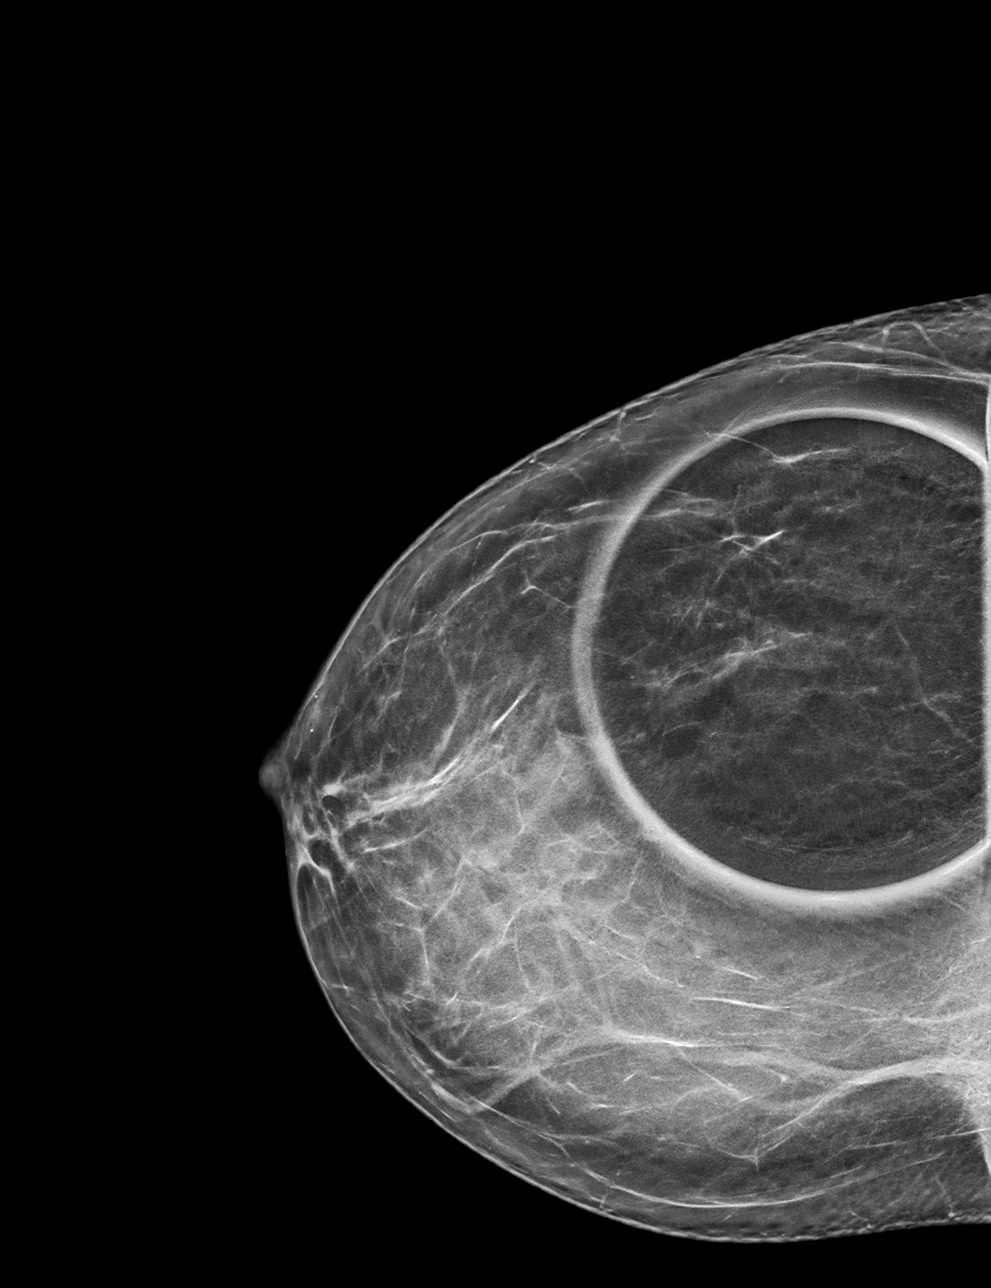

[R MLO tomo · tomo slice 33/65.0]
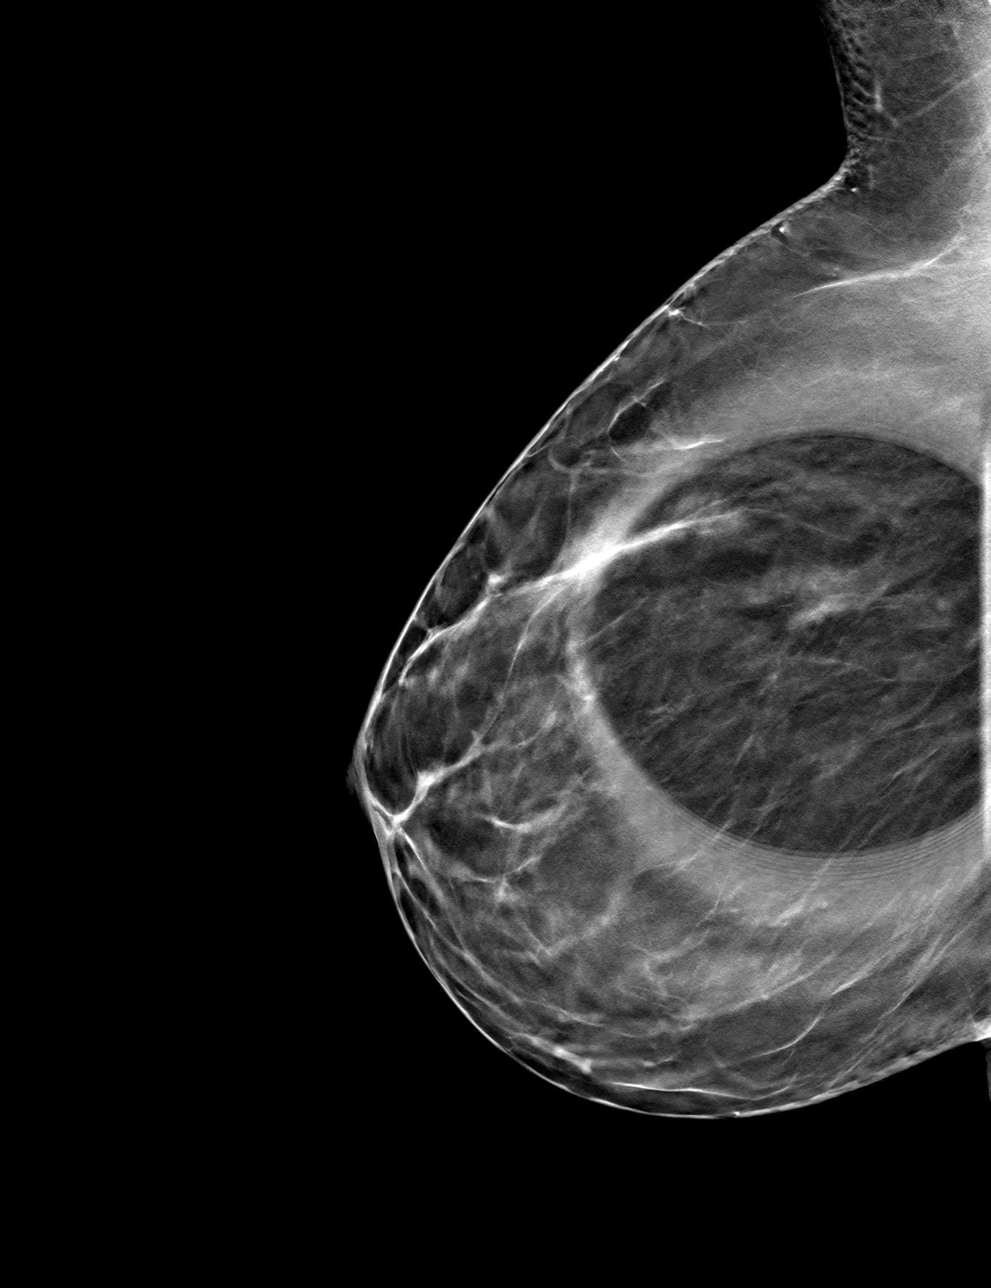

[R CC tomo · tomo slice 30/59.0]
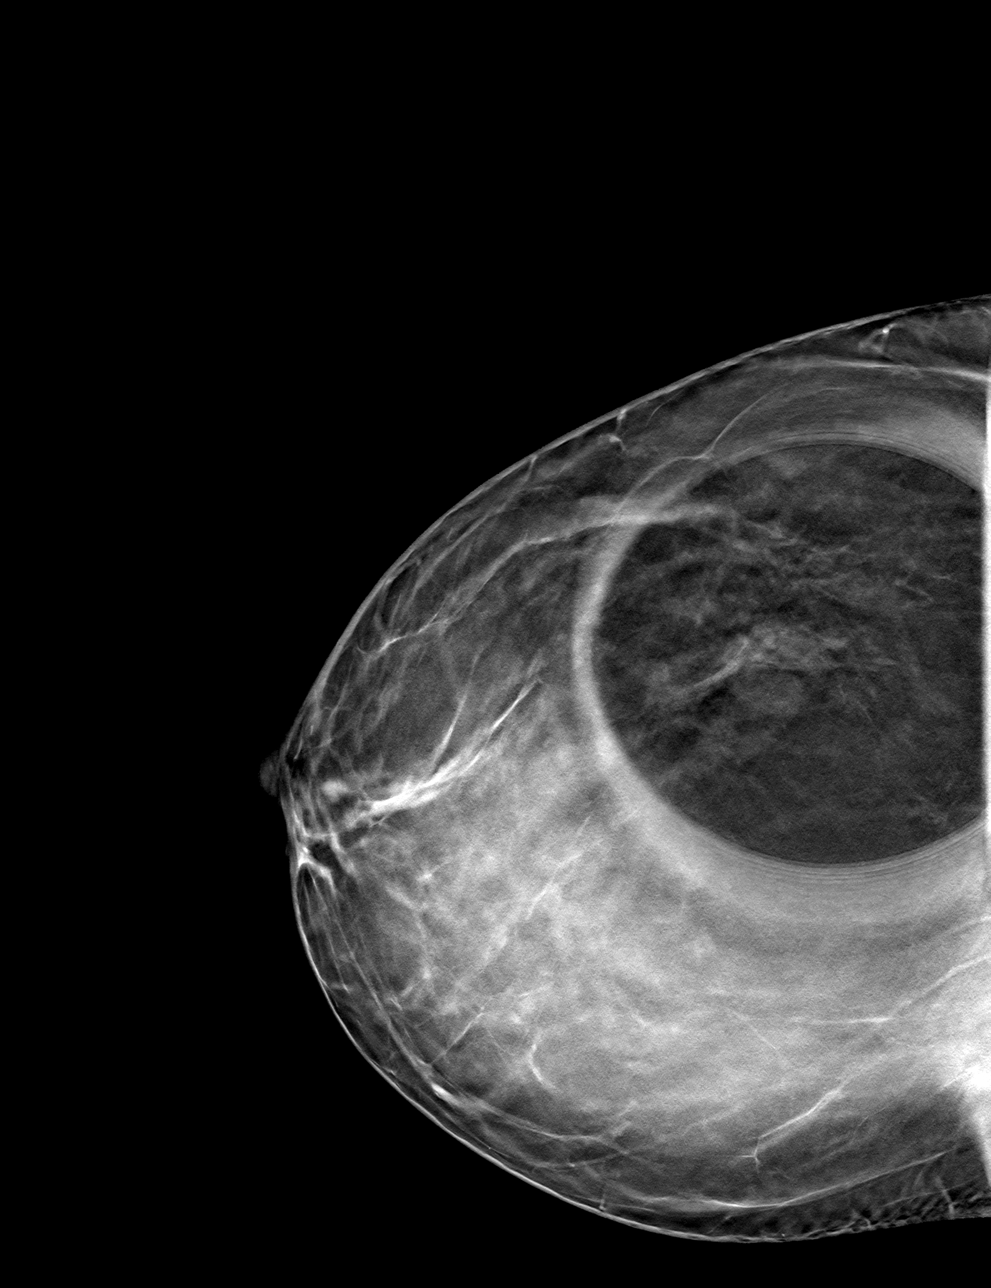

[4 of 12 positions shown; findings below may reference images not displayed]

ACR Breast Density Category b: There are scattered areas of
fibroglandular density.
FINDINGS: 2D/3D spot compression views of the RIGHT breast demonstrate
dispersal but slight persistence of the asymmetries within the
posterior UPPER-OUTER RIGHT breast.

Targeted ultrasound is performed, showing the following RIGHT breast
FINDINGS: A 0.8 x 0.6 x 1.1 cm circumscribed hypoechoic parallel mass at the
10 o'clock position 5 cm from the nipple.

A 1.2 x 0.4 x 1.4 cm circumscribed oval hypoechoic parallel mass at
the 12 o'clock position 4 cm from the nipple.

No other sonographic abnormalities within the UPPER or OUTER RIGHT
breast identified.
IMPRESSION: 1. Two likely benign masses/possible fibroadenomas within the 10
o'clock position and 12 o'clock position of the RIGHT breast, and
may account for the asymmetry identified mammographically. We
discussed management options including ultrasound-guided core biopsy
and close follow-up. Follow-up ultrasound is recommended at 6,
12,and 24 months to assess stability. The patient concurs with this
plan.

RECOMMENDATION:
RIGHT diagnostic mammogram and RIGHT breast ultrasound in 6 months.

I have discussed the findings and recommendations with the patient.
If applicable, a reminder letter will be sent to the patient
regarding the next appointment.

BI-RADS CATEGORY  3: Probably benign.

## 2020-06-14 IMAGING — US US BREAST*R* LIMITED INC AXILLA
1 series · 13 of 15 positions shown · non-contrast
Comparison: Previous exam(s).

CLINICAL DATA: 43-year-old female for further evaluation of
possible RIGHT breast asymmetry on baseline screening mammogram.

EXAM:
DIGITAL DIAGNOSTIC RIGHT MAMMOGRAM WITH CAD AND TOMO
ULTRASOUND RIGHT BREAST

[Series 1: us breast*right* limited inc axilla · 0.06mm/px · 13 of 15 slices shown]
[im 1/15]
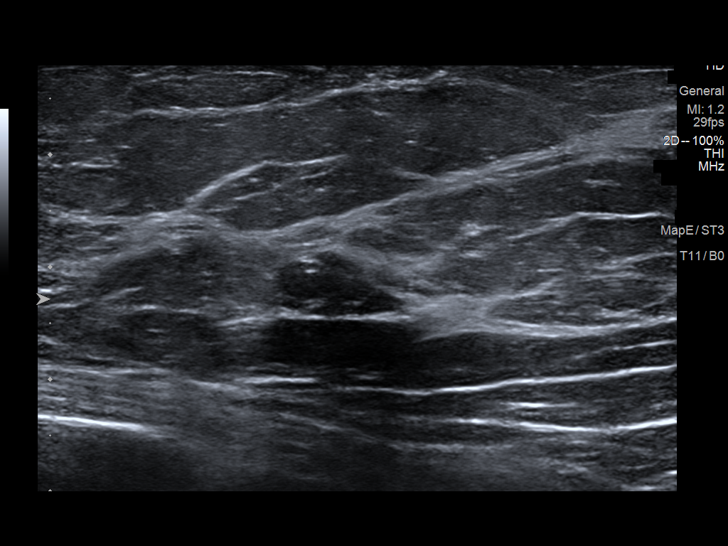
[im 2/15]
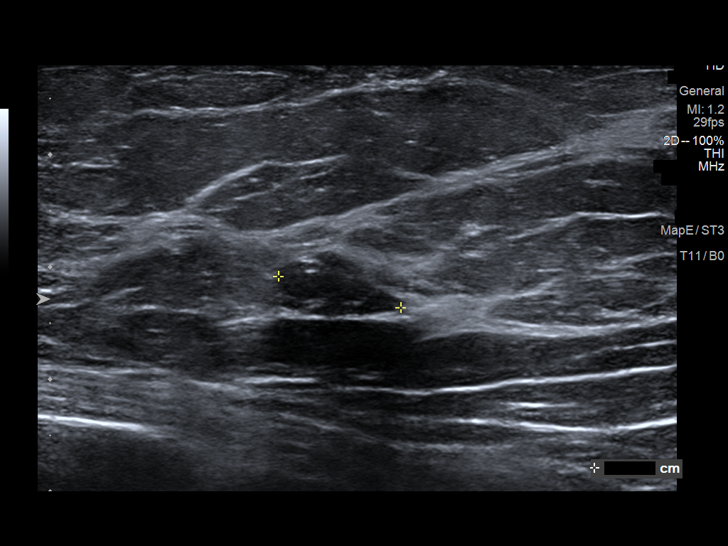
[im 3/15]
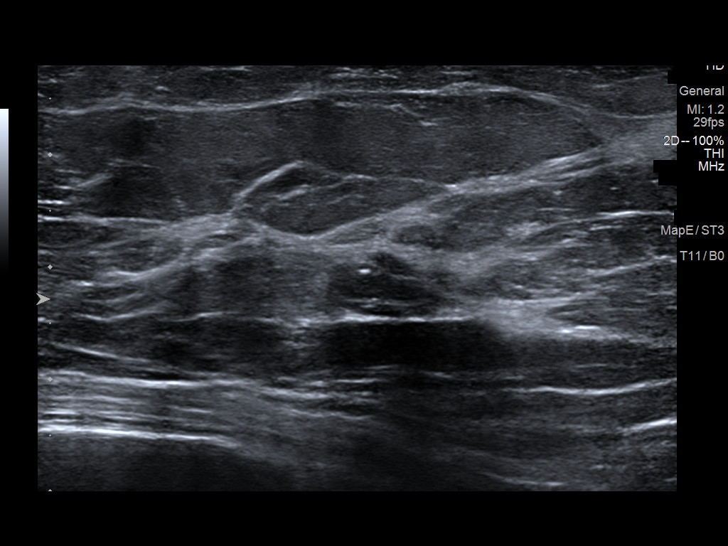
[im 5/15]
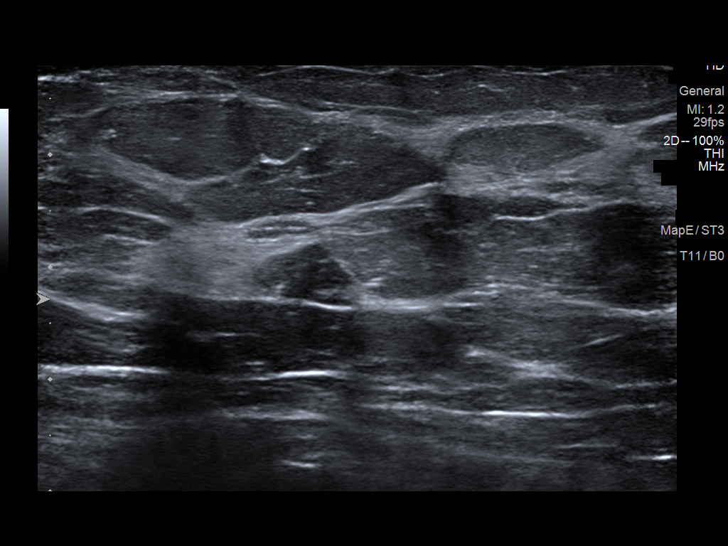
[im 6/15]
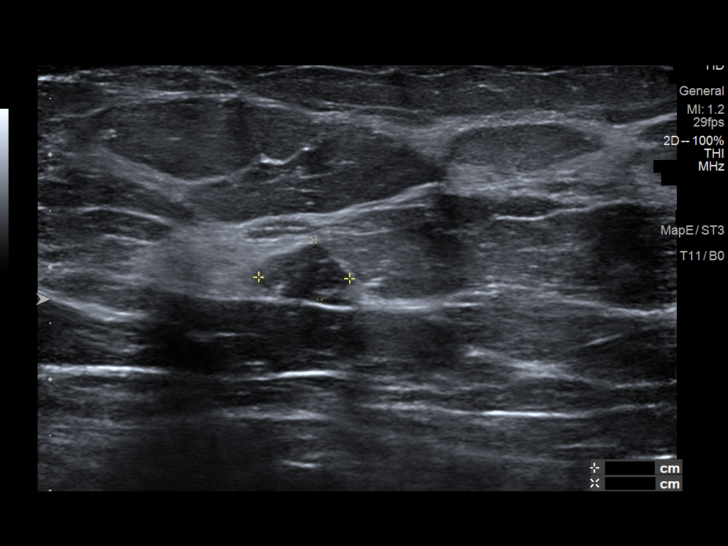
[im 7/15]
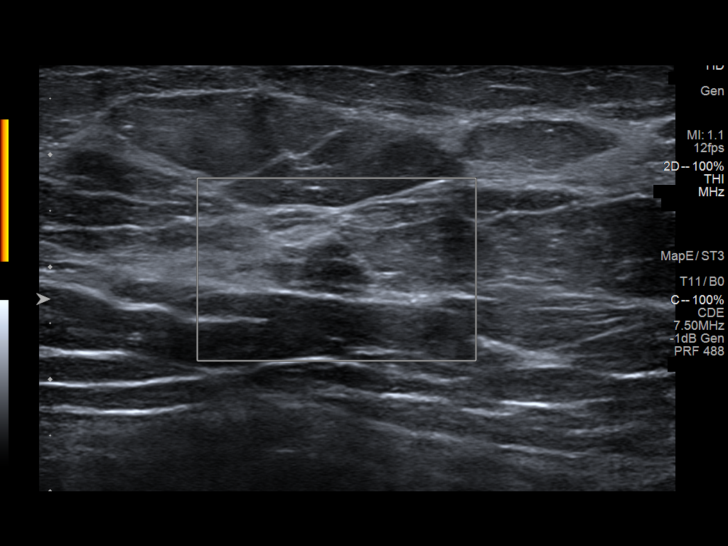
[im 8/15]
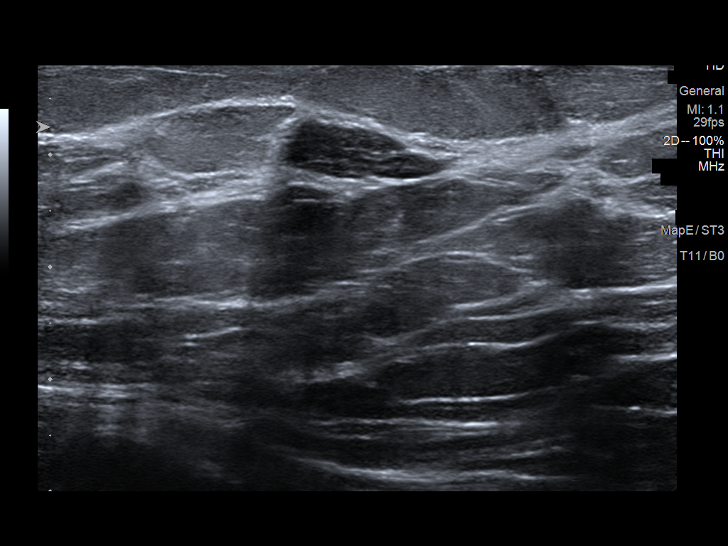
[im 9/15]
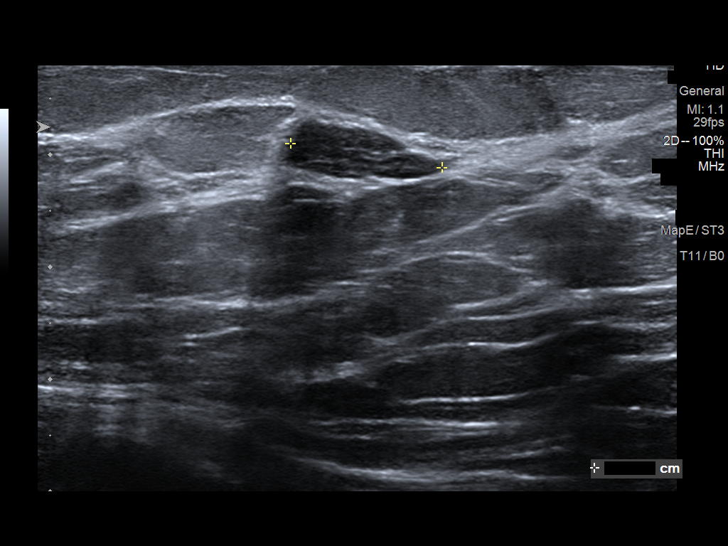
[im 10/15]
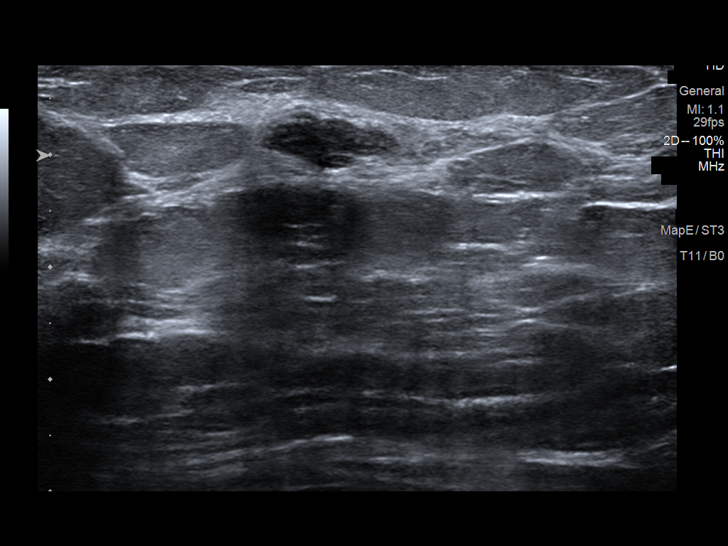
[im 11/15]
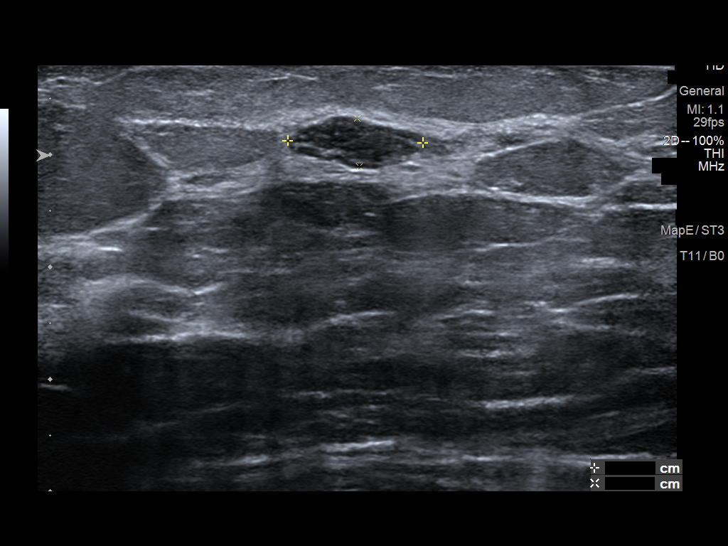
[im 13/15]
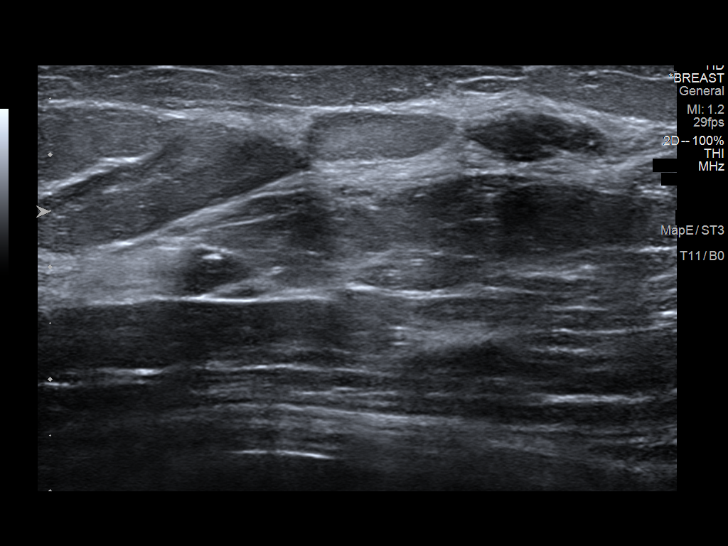
[im 14/15]
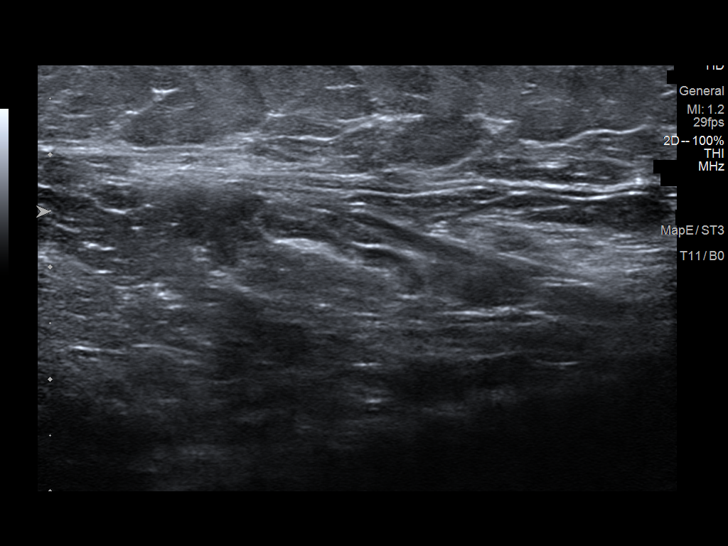
[im 15/15]
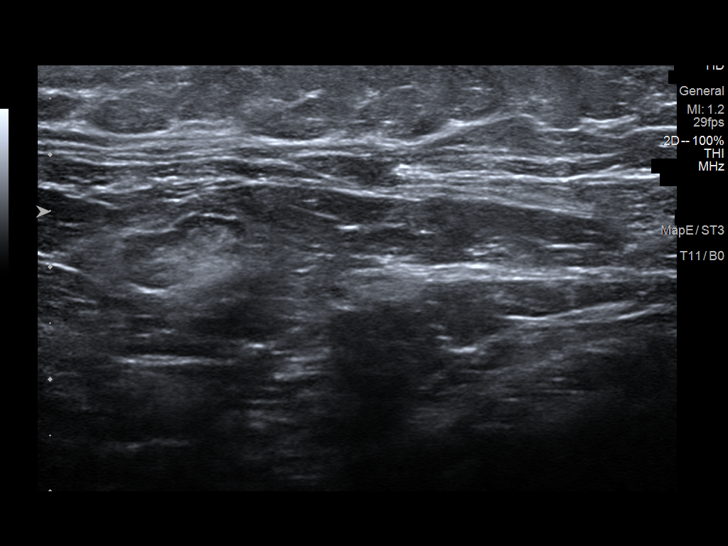

[13 of 15 positions shown; findings below may reference images not displayed]

ACR Breast Density Category b: There are scattered areas of
fibroglandular density.
FINDINGS: 2D/3D spot compression views of the RIGHT breast demonstrate
dispersal but slight persistence of the asymmetries within the
posterior UPPER-OUTER RIGHT breast.

Targeted ultrasound is performed, showing the following RIGHT breast
FINDINGS: A 0.8 x 0.6 x 1.1 cm circumscribed hypoechoic parallel mass at the
10 o'clock position 5 cm from the nipple.

A 1.2 x 0.4 x 1.4 cm circumscribed oval hypoechoic parallel mass at
the 12 o'clock position 4 cm from the nipple.

No other sonographic abnormalities within the UPPER or OUTER RIGHT
breast identified.
IMPRESSION: 1. Two likely benign masses/possible fibroadenomas within the 10
o'clock position and 12 o'clock position of the RIGHT breast, and
may account for the asymmetry identified mammographically. We
discussed management options including ultrasound-guided core biopsy
and close follow-up. Follow-up ultrasound is recommended at 6,
12,and 24 months to assess stability. The patient concurs with this
plan.

RECOMMENDATION:
RIGHT diagnostic mammogram and RIGHT breast ultrasound in 6 months.

I have discussed the findings and recommendations with the patient.
If applicable, a reminder letter will be sent to the patient
regarding the next appointment.

BI-RADS CATEGORY  3: Probably benign.

## 2020-06-18 ENCOUNTER — Ambulatory Visit: Payer: BC Managed Care – PPO | Admitting: Sports Medicine

## 2020-06-18 ENCOUNTER — Encounter: Payer: BC Managed Care – PPO | Admitting: Physical Therapy

## 2020-07-02 ENCOUNTER — Telehealth: Payer: Self-pay

## 2020-07-02 NOTE — Telephone Encounter (Signed)
Just pick back up where she left off and do a solid 6 weeks before we reevaluate.

## 2020-07-02 NOTE — Telephone Encounter (Signed)
Patient called regarding her knee. She states that she saw you and you prescribed some PT. She did some but cut it short due to a ffamily emergency that caused her to have to travel. This was a few weeks ago and her knee is bothering her again. Should she just pick back up with PT or come see you again? Please advise.

## 2020-07-02 NOTE — Telephone Encounter (Signed)
Left message as indicated below on VM with numbers for PT and our office.

## 2020-07-06 ENCOUNTER — Ambulatory Visit (INDEPENDENT_AMBULATORY_CARE_PROVIDER_SITE_OTHER): Payer: BC Managed Care – PPO | Admitting: Physical Therapy

## 2020-07-06 ENCOUNTER — Other Ambulatory Visit: Payer: Self-pay

## 2020-07-06 DIAGNOSIS — R29898 Other symptoms and signs involving the musculoskeletal system: Secondary | ICD-10-CM

## 2020-07-06 DIAGNOSIS — M25561 Pain in right knee: Secondary | ICD-10-CM | POA: Diagnosis not present

## 2020-07-06 DIAGNOSIS — R600 Localized edema: Secondary | ICD-10-CM

## 2020-07-06 NOTE — Therapy (Signed)
[place], [place], [address] Phone: [phone]   Fax:  [phone]  Physical Therapy Treatment and Reevaluation  Patient Details  Name: KARIMAH WINQUIST MRN: [id number] Date of Birth: Aug 02, 1975 Referring Provider (PT): Iran Planas   Encounter Date: 07/06/2020   PT End of Session - 07/06/20 0835    Visit Number 5    Number of Visits 18    Date for PT Re-Evaluation 08/17/20    PT Start Time 0800    PT Stop Time 0841    PT Time Calculation (min) 41 min    Activity Tolerance Patient tolerated treatment well    Behavior During Therapy [facility] for tasks assessed/performed           Past Medical History:  Diagnosis Date  . Allergy   . Anxiety   . Depression   . History of thyroid nodule   . Vaginal Pap smear, abnormal   . Vitamin D deficiency     Past Surgical History:  Procedure Laterality Date  . CHOLECYSTECTOMY      There were no vitals filed for this visit.   Subjective Assessment - 07/06/20 0804    Subjective Pt states her knee is still "annoying". States pain with descending stairs, prolonged walking, sitting with knees flexed or legs crossed    Pertinent History PT in 2019 due to Charcot Lelan Pons Tooth    Patient Stated Goals be able to return to exercise with decreased pain and swelling    Currently in Pain? Yes    Pain Score 1     Pain Location Knee    Pain Orientation Right    Pain Descriptors / Indicators Sore              [facility] PT Assessment - 07/06/20 0001      Observation/Other Assessments   Focus on Therapeutic Outcomes (FOTO)  67 functional status measure      AROM   Right Knee Extension 0    Right Knee Flexion 135    Left Knee Flexion 135      Strength   Right Knee Flexion 4+/5    Right Knee Extension 5/5      Palpation   Patella mobility crepitus with inferior mobs Rt LE    Palpation comment trigger points palpable Rt ITB                          [facility] Adult PT Treatment/Exercise - 07/06/20 0001      Knee/Hip Exercises: Stretches   Passive Hamstring Stretch Right;Left;30 seconds    Passive Hamstring Stretch Limitations with strap    Quad Stretch Right;Left;2 reps;30 seconds    Quad Stretch Limitations prone with strap    Hip Flexor Stretch Right;Left;2 reps;30 seconds    ITB Stretch Right;Left;2 reps;30 seconds      Knee/Hip Exercises: Aerobic   Recumbent Bike L3 x 5 min for warm up      Knee/Hip Exercises: Standing   Step Down Right;2 sets;20 reps;Hand Hold: 1    Step Down Limitations laterally and forward    Other Standing Knee Exercises sidestep in partial squat with red TB 20'x 4, monster walk red TB 20' x 2                       PT Long Term Goals - 07/06/20 8242      PT LONG TERM GOAL #1  Title Pt will be independent in HEP    Time 6    Period Weeks    Status On-going    Target Date 08/17/20      PT LONG TERM GOAL #2   Title Pt will improve FOTO to >= 74 to demo improved functional mobility    Baseline 67 on 07/06/20    Time 6    Period Weeks    Status On-going    Target Date 08/17/20      PT LONG TERM GOAL #3   Title Pt will demo proper form with squats and stepping down to return to gym program with decreased pain    Baseline decreased eccentric control    Time 6    Period Weeks    Status On-going    Target Date 08/17/20                 Plan - 07/06/20 0836    Clinical Impression Statement Pt has improved Rt knee strength and ROM but continues with decreased eccentric control and increased knee pain. Pt will benefit from continued PT to address deficits and return to full exercise routine    Stability/Clinical Decision Making Stable/Uncomplicated    Rehab Potential Good    PT Frequency 2x / week    PT Duration 6 weeks    PT Treatment/Interventions Vasopneumatic Device;Dry needling;Taping;Manual techniques;Neuromuscular re-education;Balance  training;Therapeutic exercise;Therapeutic activities;Gait training;Stair training;Cryotherapy;Moist Heat;Aquatic Therapy;Iontophoresis 4mg /ml Dexamethasone;Electrical Stimulation    PT Next Visit Plan eccentric control    PT Home Exercise Plan Access Code: [email or web address]    Consulted and Agree with Plan of Care Patient           Patient will benefit from skilled therapeutic intervention in order to improve the following deficits and impairments:  Pain,Decreased strength,Decreased activity tolerance,Increased edema,Decreased balance  Visit Diagnosis: Localized edema - Plan: PT plan of care cert/re-cert  Acute pain of right knee - Plan: PT plan of care cert/re-cert  Other symptoms and signs involving the musculoskeletal system - Plan: PT plan of care cert/re-cert     Problem List Patient Active Problem List   Diagnosis Date Noted  . Effusion of right knee joint 04/30/2020  . Bilateral hearing loss 03/12/2020  . Left thyroid nodule 03/12/2020  . Tinnitus of both ears 03/09/2020  . Depressed mood 06/26/2018  . 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 Elevated fasting glucose 03/15/2018  . Low TSH level 03/15/2018  . Early menopause occurring in patient age younger than 63 years 03/12/2018  . Class 1 obesity due to excess calories without serious comorbidity with body mass index (BMI) of 34.0 to 34.9 in adult 03/12/2018  . Family history of Charcot-Marie-Tooth disease 03/12/2018  . Neuropathy 03/12/2018  . Depression 02/06/2014  . Anxiety disorder 02/06/2014  . Family planning counseling 02/06/2014   Isabelle Course, PT  [facility] 07/06/2020, 8:44 AM  [facility] [place], [place], [address] Phone: [phone]   Fax:  [phone]  Name: NELIA ROGOFF MRN: [id number] Date of Birth: [birth year]

## 2020-07-06 NOTE — Telephone Encounter (Signed)
Patient called today reporting that she is doing everything the PT has for her with regard to her knee and wants to follow up sooner than the 6 weeks you recommended.

## 2020-07-06 NOTE — Telephone Encounter (Signed)
Ok sure we can re-eval sooner.

## 2020-07-06 NOTE — Telephone Encounter (Signed)
Patient aware and call was transferred to the front desk for scheduling.

## 2020-07-13 ENCOUNTER — Ambulatory Visit (INDEPENDENT_AMBULATORY_CARE_PROVIDER_SITE_OTHER): Payer: BC Managed Care – PPO | Admitting: Physical Therapy

## 2020-07-13 ENCOUNTER — Other Ambulatory Visit: Payer: Self-pay

## 2020-07-13 DIAGNOSIS — R29898 Other symptoms and signs involving the musculoskeletal system: Secondary | ICD-10-CM

## 2020-07-13 DIAGNOSIS — M25561 Pain in right knee: Secondary | ICD-10-CM

## 2020-07-13 DIAGNOSIS — R600 Localized edema: Secondary | ICD-10-CM | POA: Diagnosis not present

## 2020-07-13 NOTE — Therapy (Signed)
[place], [place], [address] Phone: [phone]   Fax:  [phone]  Physical Therapy Treatment  Patient Details  Name: ALEIGHYA MCANELLY MRN: [id number] Date of Birth: [birth year] Referring Provider (PT): Iran Planas   Encounter Date: 07/13/2020   PT End of Session - 07/13/20 0841    Visit Number 6    Number of Visits 18    Date for PT Re-Evaluation 08/17/20    PT Start Time 0803    PT Stop Time 0845    PT Time Calculation (min) 42 min    Activity Tolerance Patient tolerated treatment well    Behavior During Therapy [facility] for tasks assessed/performed           Past Medical History:  Diagnosis Date  . Allergy   . Anxiety   . Depression   . History of thyroid nodule   . Vaginal Pap smear, abnormal   . Vitamin D deficiency     Past Surgical History:  Procedure Laterality Date  . CHOLECYSTECTOMY      There were no vitals filed for this visit.   Subjective Assessment - 07/13/20 0807    Subjective Pt states that she was able to do a lot of walking over the weekend without as much pain. Pt states she is being "more mindful" of her knee    Currently in Pain? No/denies                             [facility] Adult PT Treatment/Exercise - 07/13/20 0001      Knee/Hip Exercises: Stretches   Passive Hamstring Stretch Both;2 reps;30 seconds    Passive Hamstring Stretch Limitations with strap    Hip Flexor Stretch Right;Left;30 seconds;2 reps    ITB Stretch Both;2 reps;30 seconds      Knee/Hip Exercises: Aerobic   Recumbent Bike L3 x 5 min for warm up      Knee/Hip Exercises: Machines for Strengthening   Cybex Leg Press Rt LE only focus on eccentric control 4 plates 2x10      Knee/Hip Exercises: Standing   Step Down Right;2 sets;Hand Hold: 1;10 reps;Step Height: 4"    Step Down Limitations laterally and forward    Wall Squat 3 sets   15 seconds   Other Standing Knee  Exercises sidestep in partial squat and monster walk both with red TB 20' x 4                       PT Long Term Goals - 07/06/20 6967      PT LONG TERM GOAL #1   Title Pt will be independent in HEP    Time 6    Period Weeks    Status On-going    Target Date 08/17/20      PT LONG TERM GOAL #2   Title Pt will improve FOTO to >= 74 to demo improved functional mobility    Baseline 67 on 07/06/20    Time 6    Period Weeks    Status On-going    Target Date 08/17/20      PT LONG TERM GOAL #3   Title Pt will demo proper form with squats and stepping down to return to gym program with decreased pain    Baseline decreased eccentric control    Time 6    Period Weeks    Status On-going  Target Date 08/17/20                 Plan - 07/13/20 0842    Clinical Impression Statement Pt has improved Rt knee strength but continues with decreased eccentric control and decreased quad mm endurance. Good tolerance to addition of leg press    PT Next Visit Plan continue eccentric control    PT Home Exercise Plan Access Code: [email or web address]           Patient will benefit from skilled therapeutic intervention in order to improve the following deficits and impairments:     Visit Diagnosis: Localized edema  Acute pain of right knee  Other symptoms and signs involving the musculoskeletal system     Problem List Patient Active Problem List   Diagnosis Date Noted  . Effusion of right knee joint 04/30/2020  . Bilateral hearing loss 03/12/2020  . Left thyroid nodule 03/12/2020  . Tinnitus of both ears 03/09/2020  . Depressed mood 06/26/2018  . 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 Elevated fasting glucose 03/15/2018  . Low TSH level 03/15/2018  . Early menopause occurring in patient age younger than 71 years 03/12/2018  . Class 1 obesity due to excess calories without serious comorbidity with body mass index (BMI) of 34.0 to 34.9 in adult 03/12/2018  . Family  history of Charcot-Marie-Tooth disease 03/12/2018  . Neuropathy 03/12/2018  . Depression 02/06/2014  . Anxiety disorder 02/06/2014  . Family planning counseling 02/06/2014   Isabelle Course, PT  [facility] 07/13/2020, 8:46 AM  [facility] [place], [place], [address] Phone: [phone]   Fax:  [phone]  Name: SIERRIA BRUNEY MRN: [id number] Date of Birth: 1975-04-05

## 2020-07-16 ENCOUNTER — Ambulatory Visit (INDEPENDENT_AMBULATORY_CARE_PROVIDER_SITE_OTHER): Payer: BC Managed Care – PPO | Admitting: Sports Medicine

## 2020-07-16 ENCOUNTER — Ambulatory Visit (INDEPENDENT_AMBULATORY_CARE_PROVIDER_SITE_OTHER): Payer: BC Managed Care – PPO | Admitting: Physical Therapy

## 2020-07-16 ENCOUNTER — Other Ambulatory Visit: Payer: Self-pay

## 2020-07-16 DIAGNOSIS — M25561 Pain in right knee: Secondary | ICD-10-CM | POA: Diagnosis not present

## 2020-07-16 DIAGNOSIS — M1711 Unilateral primary osteoarthritis, right knee: Secondary | ICD-10-CM

## 2020-07-16 DIAGNOSIS — R29898 Other symptoms and signs involving the musculoskeletal system: Secondary | ICD-10-CM | POA: Diagnosis not present

## 2020-07-16 DIAGNOSIS — R600 Localized edema: Secondary | ICD-10-CM

## 2020-07-16 NOTE — Patient Instructions (Signed)
Access Code: [email or web address] URL: [email or web address] Date: 07/16/2020 Prepared by: Isabelle Course  Exercises Lateral Step Down - 1 x daily - 3 x weekly - 2 sets - 10 reps Forward Step Down - 1 x daily - 3 x weekly - 2 sets - 10 reps Hooklying Hamstring Stretch with Strap - 1-2 x daily - 3 x weekly - 1 sets - 2-3 reps - 20-30 seconds hold Supine ITB Stretch with Strap - 1-2 x daily - 3 x weekly - 1 sets - 2-3 reps - 20-30 seconds hold Prone Quadriceps Stretch with Strap - 1-2 x daily - 3 x weekly - 1 sets - 2-3 reps - 20-30 seconds hold Sidelying Hip Adduction - 1 x daily - 3 x weekly - 2 sets - 10 reps Seated Hip Flexor Stretch - 1 x daily - 3 x weekly - 3 sets - 10 reps Wall Quarter Squat - 1 x daily - 3 x weekly - 1 sets - 10 reps - 3-5 seconds hold Side Stepping with Resistance at Thighs - 1 x daily - 3 x weekly - 3 sets - 10 reps Forward Monster Walks - 1 x daily - 3 x weekly - 3 sets - 10 reps Standing 3-Way Kick - 1 x daily - 3 x weekly - 3 sets - 10 reps

## 2020-07-16 NOTE — Progress Notes (Signed)
    Procedures performed today:    None.  Independent interpretation of notes and tests performed by another provider:   None.  Brief History, Exam, Impression, and Recommendations:    Primary osteoarthritis of right knee Lauren Bowers returns, she is a pleasant 44 year old female with genetic but nonphenotypic Charcot-Marie-Tooth disease. We aspirated and injected her knee at the last visit, she is done a lot better, no pain, motion is pretty good. She still has a bit of an effusion today, she has good motion and can do what she wants to do, no further intervention needed, next step would be MRI and/or viscosupplementation. Continue rehab exercises at home indefinitely, return as needed.    ___________________________________________ Lauren Bowers, M.D., ABFM., CAQSM. Primary Care and [place] Instructor of [place] [facility] [employer]

## 2020-07-16 NOTE — Therapy (Signed)
[place], [place], [address] Phone: [phone]   Fax:  [phone]  Physical Therapy Treatment and Discharge  Patient Details  Name: LAHOMA CONSTANTIN MRN: [id number] Date of Birth: May 18, 1975 Referring Provider (PT): Iran Planas   Encounter Date: 07/16/2020   PT End of Session - 07/16/20 0840    Visit Number 7    Number of Visits 18    Date for PT Re-Evaluation 08/17/20    PT Start Time 0800    PT Stop Time 0841    PT Time Calculation (min) 41 min    Activity Tolerance Patient tolerated treatment well    Behavior During Therapy [facility] for tasks assessed/performed           Past Medical History:  Diagnosis Date  . Allergy   . Anxiety   . Depression   . History of thyroid nodule   . Vaginal Pap smear, abnormal   . Vitamin D deficiency     Past Surgical History:  Procedure Laterality Date  . CHOLECYSTECTOMY      There were no vitals filed for this visit.   Subjective Assessment - 07/16/20 0802    Subjective Pt states that going down stairs straight hurts her knee but if she externally rotates hip the pain is less    Currently in Pain? No/denies              [facility] PT Assessment - 07/16/20 0001      AROM   Right Knee Flexion 130    Left Knee Flexion 135                         [facility] Adult PT Treatment/Exercise - 07/16/20 0001      Knee/Hip Exercises: Stretches   Passive Hamstring Stretch Both;2 reps;30 seconds    Passive Hamstring Stretch Limitations with strap    Quad Stretch Right;Left;2 reps;30 seconds    Hip Flexor Stretch Both;30 seconds    ITB Stretch Both;2 reps;30 seconds    ITB Stretch Limitations with strap      Knee/Hip Exercises: Aerobic   Recumbent Bike L3 x 5 min for warm up      Knee/Hip Exercises: Machines for Strengthening   Cybex Leg Press Rt LE only 2 x 10 4 plates focus on eccentric control      Knee/Hip Exercises: Standing   Step  Down Right;2 sets;10 reps;Step Height: 4";Hand Hold: 1    Step Down Limitations laterally and forward    Wall Squat 3 sets   15 sec   Other Standing Knee Exercises sidestep in partial squat red TB, monster walk red TB all 20' x 4    Other Standing Knee Exercises stance on RT 3 way kick x 10                  PT Education - 07/16/20 0839    Education Details updated HEP    Person(s) Educated Patient    Methods Explanation;Demonstration;Handout    Comprehension Returned demonstration;Verbalized understanding               PT Long Term Goals - 07/16/20 0846      PT LONG TERM GOAL #1   Title Pt will be independent in HEP    Status Achieved      PT LONG TERM GOAL #2   Title Pt will improve FOTO to >= 74 to demo improved functional mobility  Baseline 67 on 07/06/20    Status Partially Met      PT LONG TERM GOAL #3   Title Pt will demo proper form with squats and stepping down to return to gym program with decreased pain    Baseline squats improved, still pain with step downs    Status Partially Met                 Plan - 07/16/20 0845    Clinical Impression Statement Pt continues to improve Rt knee strength and eccentric control. continues with decreased mm endurance and crepitus with patellar mobs. Pt educated on advanced HEP and shows good understanding. Pt ready to d/c to HEP at this time    PT Next Visit Plan d/c to HEP    PT Home Exercise Plan Access Code: [email or web address]           Patient will benefit from skilled therapeutic intervention in order to improve the following deficits and impairments:     Visit Diagnosis: Localized edema  Acute pain of right knee  Other symptoms and signs involving the musculoskeletal system     Problem List Patient Active Problem List   Diagnosis Date Noted  . Effusion of right knee joint 04/30/2020  . Bilateral hearing loss 03/12/2020  . Left thyroid nodule 03/12/2020  . Tinnitus of both ears 03/09/2020  .  Depressed mood 06/26/2018  . 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 Elevated fasting glucose 03/15/2018  . Low TSH level 03/15/2018  . Early menopause occurring in patient age younger than 31 years 03/12/2018  . Class 1 obesity due to excess calories without serious comorbidity with body mass index (BMI) of 34.0 to 34.9 in adult 03/12/2018  . Family history of Charcot-Marie-Tooth disease 03/12/2018  . Neuropathy 03/12/2018  . Depression 02/06/2014  . Anxiety disorder 02/06/2014  . Family planning counseling 02/06/2014   PHYSICAL THERAPY DISCHARGE SUMMARY  Visits from Start of Care: 7  Current functional level related to goals / functional outcomes: Improved ability to work out without pain   Remaining deficits: Pain with stepping down   Education / Equipment: HEP Plan: Patient agrees to discharge.  Patient goals were partially met. Patient is being discharged due to being pleased with the current functional level.  ?????     Ramond Craver 07/16/2020, 8:47 AM  [facility] [place], [place], [address] Phone: [phone]   Fax:  [phone]  Name: LUMMIE MONTIJO MRN: [id number] Date of Birth: 18-Oct-1975

## 2020-07-16 NOTE — Assessment & Plan Note (Signed)
Lauren Bowers returns, she is a pleasant 45 year old female with genetic but nonphenotypic Charcot-Marie-Tooth disease. We aspirated and injected her knee at the last visit, she is done a lot better, no pain, motion is pretty good. She still has a bit of an effusion today, she has good motion and can do what she wants to do, no further intervention needed, next step would be MRI and/or viscosupplementation. Continue rehab exercises at home indefinitely, return as needed.

## 2020-07-20 ENCOUNTER — Telehealth: Payer: Self-pay | Admitting: Neurology

## 2020-07-20 NOTE — Telephone Encounter (Signed)
Unfortunately there is no medical reason for exemption. Allergy would be allergy reaction to the first vaccine. If she has not had that would not apply. Making chronic diseases worse is the rule with this vaccine but I have had reports from patients of this along with other blood clots etc but the risk is VERY low. It is still recommended to get the covid vaccine.

## 2020-07-20 NOTE — Telephone Encounter (Signed)
Patient called back and left another message, she states during her visit in January she was told that the vaccine could do nothing or could make some chronic diseases worse? She is worried about her CMT.  He note in January did state you advised her to have the vaccine. Please advise.

## 2020-07-20 NOTE — Telephone Encounter (Signed)
Patient called to get a form completed for exemption for the Covid vaccine.  Called patient back, she does not have a medical history of allergy to vaccines. No current medical problems to exempt her from vaccine. She was encouraged to get vaccine at her visit in January. She was made aware and will call back with any questions.

## 2020-07-23 NOTE — Telephone Encounter (Signed)
LMOM making patient aware of information from [place].

## 2021-01-19 ENCOUNTER — Encounter: Payer: Self-pay | Admitting: Physician Assistant

## 2021-01-19 ENCOUNTER — Other Ambulatory Visit: Payer: Self-pay

## 2021-01-19 ENCOUNTER — Ambulatory Visit (INDEPENDENT_AMBULATORY_CARE_PROVIDER_SITE_OTHER): Payer: BC Managed Care – PPO | Admitting: Physician Assistant

## 2021-01-19 VITALS — BP 110/61 | HR 73 | Temp 98.7°F | Ht 66.0 in | Wt 189.0 lb

## 2021-01-19 DIAGNOSIS — R110 Nausea: Secondary | ICD-10-CM | POA: Insufficient documentation

## 2021-01-19 DIAGNOSIS — R1011 Right upper quadrant pain: Secondary | ICD-10-CM | POA: Diagnosis not present

## 2021-01-19 DIAGNOSIS — Z9049 Acquired absence of other specified parts of digestive tract: Secondary | ICD-10-CM

## 2021-01-19 DIAGNOSIS — E041 Nontoxic single thyroid nodule: Secondary | ICD-10-CM | POA: Diagnosis not present

## 2021-01-19 DIAGNOSIS — F439 Reaction to severe stress, unspecified: Secondary | ICD-10-CM | POA: Insufficient documentation

## 2021-01-19 DIAGNOSIS — R1013 Epigastric pain: Secondary | ICD-10-CM | POA: Insufficient documentation

## 2021-01-19 DIAGNOSIS — K219 Gastro-esophageal reflux disease without esophagitis: Secondary | ICD-10-CM | POA: Insufficient documentation

## 2021-01-19 DIAGNOSIS — E0590 Thyrotoxicosis, unspecified without thyrotoxic crisis or storm: Secondary | ICD-10-CM | POA: Diagnosis not present

## 2021-01-19 MED ORDER — OMEPRAZOLE 40 MG PO CPDR: 40.0000 mg | DELAYED_RELEASE_CAPSULE | Freq: Every day | ORAL | 1 refills | Status: DC

## 2021-01-19 NOTE — Progress Notes (Signed)
Subjective:    Patient ID: Lauren Bowers, female    DOB: 30-Mar-1975, 45 y.o.   MRN: [id number]  HPI Pt is a 45 yo female with hx of cholecystectomy who presents to the clinic with epigastric and RUQ pain that started suddenly last Tuesday and lasted for 1 full day and been intermittent now. She has had lots of indigestion, nausea, bloating. She had her gallbladder removed in 2008 with no symptoms after. Her stools are loose but denies any melena or hematochezia. She is feeling more irritation after eating diary. She has tried some as needed [employer], [employer] which has helped some. No fever, vomiting, lower abdominal pain, dysuria, flank pain. No recent alcohol or NsAID use. She is going through a divorce and very stressed.    .. 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   Tinnitus of both ears 03/09/2020   Bilateral hearing loss 03/12/2020   Left thyroid nodule 03/12/2020   Primary osteoarthritis of right knee 04/30/2020   Status post cholecystectomy 01/19/2021   Gastroesophageal reflux disease without esophagitis 01/19/2021   Epigastric pain 01/19/2021   Right upper quadrant pain 01/19/2021   Nausea 01/19/2021   Stress 01/19/2021   Resolved Ambulatory Problems    Diagnosis Date Noted   Conjunctivitis 02/09/2014   Past Medical History:  Diagnosis Date   Allergy    Anxiety    History of thyroid nodule    Vaginal Pap smear, abnormal    Vitamin D  deficiency       Review of Systems See HPI.     Objective:   Physical Exam Vitals reviewed.  Constitutional:      Appearance: Normal appearance.  HENT:     Head: Normocephalic.  Cardiovascular:     Rate and Rhythm: Normal rate.  Pulmonary:     Effort: Pulmonary effort is normal.     Breath sounds: Normal breath sounds.  Abdominal:     General: Bowel sounds are normal. There is no distension.     Palpations: Abdomen is soft. There is no mass.     Tenderness: There is abdominal tenderness. There is no right CVA tenderness, left CVA tenderness, guarding or rebound.     Hernia: No hernia is present.  Neurological:     General: No focal deficit present.     Mental Status: She is alert and oriented to person, place, and time.  Psychiatric:        Mood and Affect: Mood normal.          Assessment & Plan:  [name] was seen today for abdominal pain.  Diagnoses and all orders for this visit:  Epigastric pain -     COMPLETE METABOLIC PANEL WITH GFR -     TSH -     CBC with Differential/Platelet -     H. pylori breath test -     Lipase -     omeprazole (PRILOSEC) 40 MG capsule; Take 1 capsule (40 mg total) by  mouth daily.  Right upper quadrant pain -     COMPLETE METABOLIC PANEL WITH GFR -     TSH -     CBC with Differential/Platelet -     H. pylori breath test -     Lipase  Nausea -     COMPLETE METABOLIC PANEL WITH GFR -     TSH -     CBC with Differential/Platelet -     H. pylori breath test -     Lipase -     omeprazole (PRILOSEC) 40 MG capsule; Take 1 capsule (40 mg total) by mouth daily.  Status post cholecystectomy  Gastroesophageal reflux disease without esophagitis -     omeprazole (PRILOSEC) 40 MG capsule; Take 1 capsule (40 mg total) by mouth daily.  Stress   Unclear etiology but symptoms seem consistent with gastritis. No red flags.  Labs ordered with h.pylori breath test.  Start omeprazole for 4-6 weeks.  GERD diet given.  Follow up as  needed or if symptoms worsen or change.

## 2021-01-19 NOTE — Patient Instructions (Signed)
Start omeprazole for 4-6 weeks then follow up.  Food Choices for Gastroesophageal Reflux Disease, Adult When you have gastroesophageal reflux disease (GERD), the foods you eat and your eating habits are very important. Choosing the right foods can help ease the discomfort of GERD. Consider working with a dietitian to help you make healthy food choices. What are tips for following this plan? Reading food labels Look for foods that are low in saturated fat. Foods that have less than 5% of daily value (DV) of fat and 0 g of trans fats may help with your symptoms. Cooking Cook foods using methods other than frying. This may include baking, steaming, grilling, or broiling. These are all methods that do not need a lot of fat for cooking. To add flavor, try to use herbs that are low in spice and acidity. Meal planning  Choose healthy foods that are low in fat, such as fruits, vegetables, whole grains, low-fat dairy products, lean meats, fish, and poultry. Eat frequent, small meals instead of three large meals each day. Eat your meals slowly, in a relaxed setting. Avoid bending over or lying down until 2-3 hours after eating. Limit high-fat foods such as fatty meats or fried foods. Limit your intake of fatty foods, such as oils, butter, and shortening. Avoid the following as told by your health care provider: Foods that cause symptoms. These may be different for different people. Keep a food diary to keep track of foods that cause symptoms. Alcohol. Drinking large amounts of liquid with meals. Eating meals during the 2-3 hours before bed. Lifestyle Maintain a healthy weight. Ask your health care provider what weight is healthy for you. If you need to lose weight, work with your health care provider to do so safely. Exercise for at least 30 minutes on 5 or more days each week, or as told by your health care provider. Avoid wearing clothes that fit tightly around your waist and chest. Do not use any  products that contain nicotine or tobacco. These products include cigarettes, chewing tobacco, and vaping devices, such as e-cigarettes. If you need help quitting, ask your health care provider. Sleep with the head of your bed raised. Use a wedge under the mattress or blocks under the bed frame to raise the head of the bed. Chew sugar-free gum after mealtimes. What foods should I eat? Eat a healthy, well-balanced diet of fruits, vegetables, whole grains, low-fat dairy products, lean meats, fish, and poultry. Each person is different. Foods that may trigger symptoms in one person may not trigger any symptoms in another person. Work with your health care provider to identify foods that are safe for you. The items listed above may not be a complete list of recommended foods and beverages. Contact a dietitian for more information. What foods should I avoid? Limiting some of these foods may help manage the symptoms of GERD. Everyone is different. Consult a dietitian or your health care provider to help you identify the exact foods to avoid, if any. Fruits Any fruits prepared with added fat. Any fruits that cause symptoms. For some people this may include citrus fruits, such as oranges, grapefruit, pineapple, and lemons. Vegetables Deep-fried vegetables. [place] fries. Any vegetables prepared with added fat. Any vegetables that cause symptoms. For some people, this may include tomatoes and tomato products, chili peppers, onions and garlic, and horseradish. Grains Pastries or quick breads with added fat. Meats and other proteins High-fat meats, such as fatty beef or pork, hot dogs, ribs, ham, sausage,  salami, and bacon. Fried meat or protein, including fried fish and fried chicken. Nuts and nut butters, in large amounts. Dairy Whole milk and chocolate milk. Sour cream. Cream. Ice cream. Cream cheese. Milkshakes. Fats and oils Butter. Margarine. Shortening. Ghee. Beverages Coffee and tea, with or without  caffeine. Carbonated beverages. Sodas. Energy drinks. Fruit juice made with acidic fruits, such as orange or grapefruit. Tomato juice. Alcoholic drinks. Sweets and desserts Chocolate and cocoa. Donuts. Seasonings and condiments Pepper. Peppermint and spearmint. Added salt. Any condiments, herbs, or seasonings that cause symptoms. For some people, this may include curry, hot sauce, or vinegar-based salad dressings. The items listed above may not be a complete list of foods and beverages to avoid. Contact a dietitian for more information. Questions to ask your health care provider Diet and lifestyle changes are usually the first steps that are taken to manage symptoms of GERD. If diet and lifestyle changes do not improve your symptoms, talk with your health care provider about taking medicines. Where to find more information International Foundation for Gastrointestinal Disorders: aboutgerd.org Summary When you have gastroesophageal reflux disease (GERD), food and lifestyle choices may be very helpful in easing the discomfort of GERD. Eat frequent, small meals instead of three large meals each day. Eat your meals slowly, in a relaxed setting. Avoid bending over or lying down until 2-3 hours after eating. Limit high-fat foods such as fatty meats or fried foods. This information is not intended to replace advice given to you by your health care provider. Make sure you discuss any questions you have with your health care provider. Document Revised: 09/22/2019 Document Reviewed: 09/22/2019 Elsevier Patient Education  2022 [place]. Gastritis, Adult Gastritis is swelling (inflammation) of the stomach. Gastritis can develop quickly (acute). It can also develop slowly over time (chronic). It is important to get help for this condition. If you do not get help, your stomach can bleed, and you can get sores (ulcers) in your stomach. What are the causes? This condition may be caused by: Germs that get  to your stomach. Drinking too much alcohol. Medicines you are taking. Too much acid in the stomach. A disease of the intestines or stomach. Stress. An allergic reaction. Crohn's disease. Some cancer treatments (radiation). Sometimes the cause of this condition is not known. What are the signs or symptoms? Symptoms of this condition include: Pain in your stomach. A burning feeling in your stomach. Feeling sick to your stomach (nauseous). Throwing up (vomiting). Feeling too full after you eat. Weight loss. Bad breath. Throwing up blood. Blood in your poop (stool). How is this diagnosed? This condition may be diagnosed with: Your medical history and symptoms. A physical exam. Tests. These can include: Blood tests. Stool tests. A procedure to look inside your stomach (upper endoscopy). A test in which a sample of tissue is taken for testing (biopsy). How is this treated? Treatment for this condition depends on what caused it. You may be given: Antibiotic medicine, if your condition was caused by germs. H2 blockers and similar medicines, if your condition was caused by too much acid. Follow these instructions at home: Medicines Take over-the-counter and prescription medicines only as told by your doctor. If you were prescribed an antibiotic medicine, take it as told by your doctor. Do not stop taking it even if you start to feel better. Eating and drinking  Eat small meals often, instead of large meals. Avoid foods and drinks that make your symptoms worse. Drink enough fluid to keep your  pee (urine) pale yellow. Alcohol use Do not drink alcohol if: Your doctor tells you not to drink. You are pregnant, may be pregnant, or are planning to become pregnant. If you drink alcohol: Limit your use to: 0-1 drink a day for women. 0-2 drinks a day for men. Be aware of how much alcohol is in your drink. In the [place] one drink equals one 12 oz bottle of beer (355 mL), one 5 oz glass  of wine (148 mL), or one 1 oz glass of hard liquor (44 mL). General instructions Talk with your doctor about ways to manage stress. You can exercise or do deep breathing, meditation, or yoga. Do not smoke or use products that have nicotine or tobacco. If you need help quitting, ask your doctor. Keep all follow-up visits as told by your doctor. This is important. Contact a doctor if: Your symptoms get worse. Your symptoms go away and then come back. Get help right away if: You throw up blood or something that looks like coffee grounds. You have black or dark red poop. You throw up any time you try to drink fluids. Your stomach pain gets worse. You have a fever. You do not feel better after one week. Summary Gastritis is swelling (inflammation) of the stomach. You must get help for this condition. If you do not get help, your stomach can bleed, and you can get sores (ulcers). This condition is diagnosed with medical history, physical exam, or tests. You can be treated with medicines for germs or medicines to block too much acid in your stomach. This information is not intended to replace advice given to you by your health care provider. Make sure you discuss any questions you have with your health care provider. Document Revised: 07/31/2017 Document Reviewed: 07/31/2017 Elsevier Patient Education  [place].

## 2021-01-20 ENCOUNTER — Other Ambulatory Visit: Payer: Self-pay | Admitting: Physician Assistant

## 2021-01-20 NOTE — Progress Notes (Signed)
Lauren Bowers,   Your glucose, kidney, liver, electrolytes looks great.  Normal lipase, a pancreatic enzyme.  Normal WBC and hemoglobin.  Your TSH is a little low showing HYPER "too much" thyroid in the body. We will see if we can add Free T4, Free T3, thyroid antibodies "thyroglobin and anti-TPO".

## 2021-01-21 NOTE — Progress Notes (Signed)
Lauren Bowers,   H.pylori negative.

## 2021-01-24 ENCOUNTER — Encounter: Payer: Self-pay | Admitting: Physician Assistant

## 2021-01-24 ENCOUNTER — Telehealth: Payer: Self-pay | Admitting: *Deleted

## 2021-01-24 DIAGNOSIS — E0590 Thyrotoxicosis, unspecified without thyrotoxic crisis or storm: Secondary | ICD-10-CM | POA: Insufficient documentation

## 2021-01-24 LAB — CBC WITH DIFFERENTIAL/PLATELET
Absolute Monocytes: 689 cells/uL (ref 200–950)
Basophils Absolute: 53 cells/uL (ref 0–200)
Basophils Relative: 0.5 %
Eosinophils Absolute: 170 cells/uL (ref 15–500)
Eosinophils Relative: 1.6 %
HCT: 42 % (ref 35.0–45.0)
Hemoglobin: 14.4 g/dL (ref 11.7–15.5)
Lymphs Abs: 2237 cells/uL (ref 850–3900)
MCH: 31.3 pg (ref 27.0–33.0)
MCHC: 34.3 g/dL (ref 32.0–36.0)
MCV: 91.3 fL (ref 80.0–100.0)
MPV: 10.7 fL (ref 7.5–12.5)
Monocytes Relative: 6.5 %
Neutro Abs: 7452 cells/uL (ref 1500–7800)
Neutrophils Relative %: 70.3 %
Platelets: 290 10*3/uL (ref 140–400)
RBC: 4.6 10*6/uL (ref 3.80–5.10)
RDW: 12 % (ref 11.0–15.0)
Total Lymphocyte: 21.1 %
WBC: 10.6 10*3/uL (ref 3.8–10.8)

## 2021-01-24 LAB — COMPLETE METABOLIC PANEL WITH GFR
AG Ratio: 1.5 (calc) (ref 1.0–2.5)
ALT: 11 U/L (ref 6–29)
AST: 13 U/L (ref 10–35)
Albumin: 4 g/dL (ref 3.6–5.1)
Alkaline phosphatase (APISO): 65 U/L (ref 31–125)
BUN: 15 mg/dL (ref 7–25)
CO2: 30 mmol/L (ref 20–32)
Calcium: 9.5 mg/dL (ref 8.6–10.2)
Chloride: 104 mmol/L (ref 98–110)
Creat: 0.67 mg/dL (ref 0.50–0.99)
Globulin: 2.6 g/dL (calc) (ref 1.9–3.7)
Glucose, Bld: 95 mg/dL (ref 65–99)
Potassium: 4.6 mmol/L (ref 3.5–5.3)
Sodium: 140 mmol/L (ref 135–146)
Total Bilirubin: 0.3 mg/dL (ref 0.2–1.2)
Total Protein: 6.6 g/dL (ref 6.1–8.1)
eGFR: 108 mL/min/{1.73_m2} (ref >=60)

## 2021-01-24 LAB — T4, FREE: Free T4: 1.3 ng/dL (ref 0.8–1.8)

## 2021-01-24 LAB — T3, FREE: T3, Free: 3.4 pg/mL (ref 2.3–4.2)

## 2021-01-24 LAB — THYROGLOBULIN ANTIBODY: Thyroglobulin Ab: <1 IU/mL (ref <=1)

## 2021-01-24 LAB — THYROID PEROXIDASE ANTIBODY: Thyroperoxidase Ab SerPl-aCnc: 2 IU/mL (ref <9)

## 2021-01-24 LAB — TSH: TSH: 0.29 mIU/L — ABNORMAL LOW

## 2021-01-24 LAB — LIPASE: Lipase: 24 U/L (ref 7–60)

## 2021-01-24 LAB — H. PYLORI BREATH TEST: H. pylori Breath Test: NOT DETECTED

## 2021-01-24 NOTE — Progress Notes (Signed)
Yes lets discuss this first.

## 2021-01-24 NOTE — Telephone Encounter (Signed)
Returned call from 8:48 AM. Left patient a message to call and schedule annual and an appointment for her daughter.

## 2021-01-24 NOTE — Progress Notes (Signed)
Free levels look good with TSH low. This represents subclinical hyperthyroidism. Recheck in 3 months and lets see where this is trending. Keep an eye out for any HYPER thyroid symptoms and let me know if become symptomatic.

## 2021-01-24 NOTE — Progress Notes (Signed)
No antibodies to thyroid.

## 2021-01-25 ENCOUNTER — Telehealth: Payer: Self-pay | Admitting: *Deleted

## 2021-01-25 NOTE — Telephone Encounter (Signed)
Left patient a message to call and schedule appointment for her and her daughter.

## 2021-01-26 ENCOUNTER — Ambulatory Visit (INDEPENDENT_AMBULATORY_CARE_PROVIDER_SITE_OTHER): Payer: BC Managed Care – PPO | Admitting: Physician Assistant

## 2021-01-26 ENCOUNTER — Other Ambulatory Visit: Payer: Self-pay

## 2021-01-26 ENCOUNTER — Telehealth: Payer: Self-pay | Admitting: *Deleted

## 2021-01-26 VITALS — BP 129/84 | HR 69 | Ht 66.0 in | Wt 188.0 lb

## 2021-01-26 DIAGNOSIS — K635 Polyp of colon: Secondary | ICD-10-CM

## 2021-01-26 DIAGNOSIS — Z1211 Encounter for screening for malignant neoplasm of colon: Secondary | ICD-10-CM | POA: Diagnosis not present

## 2021-01-26 DIAGNOSIS — E0590 Thyrotoxicosis, unspecified without thyrotoxic crisis or storm: Secondary | ICD-10-CM | POA: Diagnosis not present

## 2021-01-26 DIAGNOSIS — Z716 Tobacco abuse counseling: Secondary | ICD-10-CM

## 2021-01-26 DIAGNOSIS — E041 Nontoxic single thyroid nodule: Secondary | ICD-10-CM

## 2021-01-26 MED ORDER — BUPROPION HCL ER (SR) 100 MG PO TB12
100.0000 mg | ORAL_TABLET | Freq: Two times a day (BID) | ORAL | 2 refills | Status: DC
Start: 2021-01-26 — End: 2021-02-23

## 2021-01-26 NOTE — Progress Notes (Signed)
Subjective:    Patient ID: Lauren Bowers, female    DOB: [birth year], 45 y.o.   MRN: [id number]  HPI Pt is a 45 yo female with hx of thyroid nodule and smoker who presents to the clinic to go over labs.   Her TSH was low and having hyperthyroid symptoms for over a year. She has trouble sleeping, hot flashes, anxiety, aggitation. She has thought it was menopausal symptoms. No problems swallowing.   No family hx of thyroid problems.   .. 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     Objective:   Physical Exam Vitals reviewed.  Constitutional:      Appearance: Normal appearance.  HENT:     Head: Normocephalic.  Neck:     Comments: No thyroid tenderness but small nodule on left  thyroid to palpation.  Cardiovascular:     Rate and Rhythm: Normal rate and regular rhythm.     Pulses: Normal pulses.  Pulmonary:     Effort: Pulmonary effort is normal.     Breath sounds: Normal breath sounds.  Neurological:     General: No focal deficit present.     Mental Status: She is alert and oriented to person, place, and time.  Psychiatric:        Mood and Affect: Mood normal.          Assessment & Plan:  [name] was seen today for annual exam.  Diagnoses and all orders for this visit:  Subclinical hyperthyroidism -     US THYROID; Future -     Ambulatory referral to Endocrinology  Encounter for smoking cessation counseling -     buPROPion ER (WELLBUTRIN SR) 100 MG 12 hr tablet; Take 1 tablet (100 mg total) by mouth 2 (two) times daily.  Hyperplastic colonic polyp, unspecified part of colon -     Ambulatory referral to Gastroenterology  Colon cancer screening -     Ambulatory referral to Gastroenterology  Thyroid nodule -     US THYROID; Future -     Ambulatory referral to Endocrinology  Discussed labs drawn at last visit.  TSH low but free T4 and T3 normal.  Discussed subclinical hyperthyroidism. She does have some hyperthyroid symptoms although could be  explained by menopause as well.  Hx of thyroid nodule and palpated nodule today.  Repeat thyroid u/s. Referral to endocrinology for more testing and management.   Discussed need for colonoscopy. Pt is higher risk since she has already had polyp.  Referral placed.   Discussed need for smoking cessation. Pt will try zyban. Discussed cessation tips. Slowly decrease over the next 3 months.   Spent 30 minutes with patient reviewing chart, discussing plan, and coordinating care.

## 2021-01-26 NOTE — Patient Instructions (Signed)
Hyperthyroidism Hyperthyroidism is when the thyroid gland is too active (overactive). The thyroid gland is a small gland located in the lower front part of the neck, just in front of the windpipe (trachea). This gland makes hormones that help control how the body uses food for energy (metabolism) as well as how the heart and brain function. These hormones also play a role in keeping your bones strong. When the thyroid is overactive, it produces too much of a hormone called thyroxine. What are the causes? This condition may be caused by: Graves' disease. This is a disorder in which the body's disease-fighting system (immune system) attacks the thyroid gland. This is the most common cause. Inflammation of the thyroid gland. A tumor in the thyroid gland. Use of certain medicines, including: Prescription thyroid hormone replacement. Herbal supplements that mimic thyroid hormones. Amiodarone therapy. Solid or fluid-filled lumps within your thyroid gland (thyroid nodules). Taking in a large amount of iodine from foods or medicines. What increases the risk? You are more likely to develop this condition if: You are female. You have a family history of thyroid conditions. You smoke tobacco. You use a medicine called lithium. You take medicines that affect the immune system (immunosuppressants). What are the signs or symptoms? Symptoms of this condition include: Nervousness. Inability to tolerate heat. Unexplained weight loss. Diarrhea. Change in the texture of hair or skin. Heart skipping beats or making extra beats. Rapid heart rate. Loss of menstruation. Shaky hands. Fatigue. Restlessness. Sleep problems. Enlarged thyroid gland or a lump in the thyroid (nodule). You may also have symptoms of Graves' disease, which may include: Protruding eyes. Dry eyes. Red or swollen eyes. Problems with vision. How is this diagnosed? This condition may be diagnosed based on: Your symptoms and  medical history. A physical exam. Blood tests. Thyroid ultrasound. This test involves using sound waves to produce images of the thyroid gland. A thyroid scan. A radioactive substance is injected into a vein, and images show how much iodine is present in the thyroid. Radioactive iodine uptake test (RAIU). A small amount of radioactive iodine is given by mouth to see how much iodine the thyroid absorbs after a certain amount of time. How is this treated? Treatment depends on the cause and severity of the condition. Treatment may include: Medicines to reduce the amount of thyroid hormone your body makes. Radioactive iodine treatment (radioiodine therapy). This involves swallowing a small dose of radioactive iodine, in capsule or liquid form, to kill thyroid cells. Surgery to remove part or all of your thyroid gland. You may need to take thyroid hormone replacement medicine for the rest of your life after thyroid surgery. Medicines to help manage your symptoms. Follow these instructions at home:  Take over-the-counter and prescription medicines only as told by your health care provider. Do not use any products that contain nicotine or tobacco, such as cigarettes and e-cigarettes. If you need help quitting, ask your health care provider. Follow any instructions from your health care provider about diet. You may be instructed to limit foods that contain iodine. Keep all follow-up visits as told by your health care provider. This is important. You will need to have blood tests regularly so that your health care provider can monitor your condition. Contact a health care provider if: Your symptoms do not get better with treatment. You have a fever. You are taking thyroid hormone replacement medicine and you: Have symptoms of depression. Feel like you are tired all the time. Gain weight. Get help right  away if: You have chest pain. You have decreased alertness or a change in your awareness. You  have abdominal pain. You feel dizzy. You have a rapid heartbeat. You have an irregular heartbeat. You have difficulty breathing. Summary The thyroid gland is a small gland located in the lower front part of the neck, just in front of the windpipe (trachea). Hyperthyroidism is when the thyroid gland is too active (overactive) and produces too much of a hormone called thyroxine. The most common cause is Graves' disease, a disorder in which your immune system attacks the thyroid gland. Hyperthyroidism can cause various symptoms, such as unexplained weight loss, nervousness, inability to tolerate heat, or changes in your heartbeat. Treatment may include medicine to reduce the amount of thyroid hormone your body makes, radioiodine therapy, surgery, or medicines to manage symptoms. This information is not intended to replace advice given to you by your health care provider. Make sure you discuss any questions you have with your health care provider. Document Revised: 11/27/2019 Document Reviewed: 11/27/2019 Elsevier Patient Education  2022 [employer].

## 2021-01-26 NOTE — Telephone Encounter (Signed)
Left patient a message to call and schedule appointment for herself and daughter Judson Roch.

## 2021-01-28 ENCOUNTER — Encounter: Payer: Self-pay | Admitting: Physician Assistant

## 2021-02-01 ENCOUNTER — Ambulatory Visit (INDEPENDENT_AMBULATORY_CARE_PROVIDER_SITE_OTHER): Payer: BC Managed Care – PPO

## 2021-02-01 ENCOUNTER — Other Ambulatory Visit: Payer: Self-pay

## 2021-02-01 DIAGNOSIS — E041 Nontoxic single thyroid nodule: Secondary | ICD-10-CM | POA: Diagnosis not present

## 2021-02-01 DIAGNOSIS — E0590 Thyrotoxicosis, unspecified without thyrotoxic crisis or storm: Secondary | ICD-10-CM | POA: Diagnosis not present

## 2021-02-02 ENCOUNTER — Other Ambulatory Visit: Payer: Self-pay | Admitting: Physician Assistant

## 2021-02-02 DIAGNOSIS — E042 Nontoxic multinodular goiter: Secondary | ICD-10-CM

## 2021-02-02 DIAGNOSIS — E041 Nontoxic single thyroid nodule: Secondary | ICD-10-CM

## 2021-02-02 NOTE — Progress Notes (Signed)
Thyroid ultrasound did show suspicious thyroid nodule. I have ordered a thyroid biopsy.

## 2021-02-09 ENCOUNTER — Encounter: Payer: Self-pay | Admitting: Physician Assistant

## 2021-02-09 ENCOUNTER — Ambulatory Visit (INDEPENDENT_AMBULATORY_CARE_PROVIDER_SITE_OTHER): Payer: BC Managed Care – PPO | Admitting: Physician Assistant

## 2021-02-09 ENCOUNTER — Other Ambulatory Visit: Payer: Self-pay

## 2021-02-09 VITALS — BP 121/69 | HR 68 | Temp 98.3°F | Ht 66.0 in | Wt 185.0 lb

## 2021-02-09 DIAGNOSIS — N3001 Acute cystitis with hematuria: Secondary | ICD-10-CM | POA: Diagnosis not present

## 2021-02-09 DIAGNOSIS — R300 Dysuria: Secondary | ICD-10-CM | POA: Diagnosis not present

## 2021-02-09 DIAGNOSIS — R109 Unspecified abdominal pain: Secondary | ICD-10-CM

## 2021-02-09 LAB — POCT URINALYSIS DIP (CLINITEK)
Bilirubin, UA: NEGATIVE
Glucose, UA: NEGATIVE mg/dL
Ketones, POC UA: NEGATIVE mg/dL
Nitrite, UA: NEGATIVE
POC PROTEIN,UA: NEGATIVE
Spec Grav, UA: 1.02 (ref 1.010–1.025)
Urobilinogen, UA: 0.2 E.U./dL
pH, UA: 7 (ref 5.0–8.0)

## 2021-02-09 MED ORDER — PHENAZOPYRIDINE HCL 200 MG PO TABS: 200.0000 mg | ORAL_TABLET | Freq: Three times a day (TID) | ORAL | 0 refills | Status: AC

## 2021-02-09 MED ORDER — NITROFURANTOIN MONOHYD MACRO 100 MG PO CAPS: 100.0000 mg | ORAL_CAPSULE | Freq: Two times a day (BID) | ORAL | 0 refills | Status: DC

## 2021-02-09 NOTE — Progress Notes (Signed)
Subjective:    Patient ID: Lauren Bowers, female    DOB: [birth year], 45 y.o.   MRN: [id number]  HPI Pt is a 45 yo female who presents to the clinic with 1 week of dysuria, urinary frequency, and lower abdominal pressure. No fever, chills, abdominal pain, flank pain. No nausea or vomiting. Drinking more cranberry juice and symptoms seemed to improve but then came right back. Last UTI was 2014. Pt is a smoker.   .. 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   Bilateral hearing loss 03/12/2020   Left thyroid nodule 03/12/2020   Primary osteoarthritis of right knee 04/30/2020   Status post cholecystectomy 01/19/2021   Gastroesophageal reflux disease without esophagitis 01/19/2021   Epigastric pain 01/19/2021   Right upper quadrant pain 01/19/2021   Nausea 01/19/2021   Stress 01/19/2021   Subclinical hyperthyroidism 01/24/2021   Resolved Ambulatory Problems    Diagnosis Date Noted   Conjunctivitis 02/09/2014   Past Medical History:  Diagnosis Date   Allergy    Anxiety    History of thyroid nodule    Vaginal Pap smear, abnormal    Vitamin D deficiency     Review of Systems See HPI.     Objective:   Physical Exam Vitals reviewed.  Constitutional:      Appearance: Normal appearance.  HENT:     Head: Normocephalic.  Cardiovascular:     Rate and Rhythm: Normal rate and regular rhythm.     Pulses: Normal  pulses.  Pulmonary:     Effort: Pulmonary effort is normal.     Breath sounds: Normal breath sounds.  Abdominal:     General: Bowel sounds are normal. There is no distension.     Palpations: Abdomen is soft. There is no mass.     Tenderness: There is no abdominal tenderness. There is no right CVA tenderness, left CVA tenderness, guarding or rebound.  Neurological:     Mental Status: She is alert.  Psychiatric:        Mood and Affect: Mood normal.  . Results for orders placed or performed in visit on 02/09/21  POCT URINALYSIS DIP (CLINITEK)  Result Value Ref Range   Color, UA yellow yellow   Clarity, UA cloudy (A) clear   Glucose, UA negative negative mg/dL   Bilirubin, UA negative negative   Ketones, POC UA negative negative mg/dL   Spec Grav, UA 1.020 1.010 - 1.025   Blood, UA trace-lysed (A) negative   pH, UA 7.0 5.0 - 8.0   POC PROTEIN,UA negative negative, trace   Urobilinogen, UA 0.2 0.2 or 1.0 E.U./dL   Nitrite, UA Negative Negative   Leukocytes, UA Small (1+) (A) Negative           Assessment & Plan:  Lauren KitchenMarland KitchenDeidra was seen today for dysuria.  Diagnoses and all orders for this visit:  Acute cystitis with hematuria -     phenazopyridine (PYRIDIUM) 200 MG  tablet; Take 1 tablet (200 mg total) by mouth 3 (three) times daily for 2 days. -     nitrofurantoin, macrocrystal-monohydrate, (MACROBID) 100 MG capsule; Take 1 capsule (100 mg total) by mouth 2 (two) times daily.  Dysuria -     POCT URINALYSIS DIP (CLINITEK) -     Urine Culture -     phenazopyridine (PYRIDIUM) 200 MG tablet; Take 1 tablet (200 mg total) by mouth 3 (three) times daily for 2 days. -     nitrofurantoin, macrocrystal-monohydrate, (MACROBID) 100 MG capsule; Take 1 capsule (100 mg total) by mouth 2 (two) times daily.  Abdominal pressure -     phenazopyridine (PYRIDIUM) 200 MG tablet; Take 1 tablet (200 mg total) by mouth 3 (three) times daily for 2 days. -     nitrofurantoin, macrocrystal-monohydrate,  (MACROBID) 100 MG capsule; Take 1 capsule (100 mg total) by mouth 2 (two) times daily.   UA positive for blood and leuks.  Will culture.  Will empirically treat with macrobid and pyridium.  Increase hydration.  Come back to make sure hematuria has resolved with repeat UA.

## 2021-02-09 NOTE — Patient Instructions (Signed)
Urinary Tract Infection, Adult A urinary tract infection (UTI) is an infection of any part of the urinary tract. The urinary tract includes the kidneys, ureters, bladder, and urethra. These organs make, store, and get rid of urine in the body. An upper UTI affects the ureters and kidneys. A lower UTI affects the bladder and urethra. What are the causes? Most urinary tract infections are caused by bacteria in your genital area around your urethra, where urine leaves your body. These bacteria grow and cause inflammation of your urinary tract. What increases the risk? You are more likely to develop this condition if: You have a urinary catheter that stays in place. You are not able to control when you urinate or have a bowel movement (incontinence). You are female and you: Use a spermicide or diaphragm for birth control. Have low estrogen levels. Are pregnant. You have certain genes that increase your risk. You are sexually active. You take antibiotic medicines. You have a condition that causes your flow of urine to slow down, such as: An enlarged prostate, if you are female. Blockage in your urethra. A kidney stone. A nerve condition that affects your bladder control (neurogenic bladder). Not getting enough to drink, or not urinating often. You have certain medical conditions, such as: Diabetes. A weak disease-fighting system (immunesystem). Sickle cell disease. Gout. Spinal cord injury. What are the signs or symptoms? Symptoms of this condition include: Needing to urinate right away (urgency). Frequent urination. This may include small amounts of urine each time you urinate. Pain or burning with urination. Blood in the urine. Urine that smells bad or unusual. Trouble urinating. Cloudy urine. Vaginal discharge, if you are female. Pain in the abdomen or the lower back. You may also have: Vomiting or a decreased appetite. Confusion. Irritability or tiredness. A fever or  chills. Diarrhea. The first symptom in older adults may be confusion. In some cases, they may not have any symptoms until the infection has worsened. How is this diagnosed? This condition is diagnosed based on your medical history and a physical exam. You may also have other tests, including: Urine tests. Blood tests. Tests for STIs (sexually transmitted infections). If you have had more than one UTI, a cystoscopy or imaging studies may be done to determine the cause of the infections. How is this treated? Treatment for this condition includes: Antibiotic medicine. Over-the-counter medicines to treat discomfort. Drinking enough water to stay hydrated. If you have frequent infections or have other conditions such as a kidney stone, you may need to see a health care provider who specializes in the urinary tract (urologist). In rare cases, urinary tract infections can cause sepsis. Sepsis is a life-threatening condition that occurs when the body responds to an infection. Sepsis is treated in the hospital with IV antibiotics, fluids, and other medicines. Follow these instructions at home: Medicines Take over-the-counter and prescription medicines only as told by your health care provider. If you were prescribed an antibiotic medicine, take it as told by your health care provider. Do not stop using the antibiotic even if you start to feel better. General instructions Make sure you: Empty your bladder often and completely. Do not hold urine for long periods of time. Empty your bladder after sex. Wipe from front to back after urinating or having a bowel movement if you are female. Use each tissue only one time when you wipe. Drink enough fluid to keep your urine pale yellow. Keep all follow-up visits. This is important. Contact a health care provider   if: Your symptoms do not get better after 1-2 days. Your symptoms go away and then return. Get help right away if: You have severe pain in your  back or your lower abdomen. You have a fever or chills. You have nausea or vomiting. Summary A urinary tract infection (UTI) is an infection of any part of the urinary tract, which includes the kidneys, ureters, bladder, and urethra. Most urinary tract infections are caused by bacteria in your genital area. Treatment for this condition often includes antibiotic medicines. If you were prescribed an antibiotic medicine, take it as told by your health care provider. Do not stop using the antibiotic even if you start to feel better. Keep all follow-up visits. This is important. This information is not intended to replace advice given to you by your health care provider. Make sure you discuss any questions you have with your health care provider. Document Revised: 10/24/2019 Document Reviewed: 10/24/2019 Elsevier Patient Education  2022 [employer]  

## 2021-02-10 ENCOUNTER — Other Ambulatory Visit: Payer: Self-pay | Admitting: Physician Assistant

## 2021-02-10 DIAGNOSIS — K219 Gastro-esophageal reflux disease without esophagitis: Secondary | ICD-10-CM

## 2021-02-10 DIAGNOSIS — R110 Nausea: Secondary | ICD-10-CM

## 2021-02-10 DIAGNOSIS — R1013 Epigastric pain: Secondary | ICD-10-CM

## 2021-02-12 LAB — URINE CULTURE
MICRO NUMBER:: 12649175
SPECIMEN QUALITY:: ADEQUATE

## 2021-02-14 NOTE — Progress Notes (Signed)
E.coli found and abx should treat.

## 2021-02-21 ENCOUNTER — Other Ambulatory Visit: Payer: Self-pay | Admitting: Physician Assistant

## 2021-02-21 DIAGNOSIS — Z716 Tobacco abuse counseling: Secondary | ICD-10-CM

## 2021-03-02 ENCOUNTER — Ambulatory Visit (INDEPENDENT_AMBULATORY_CARE_PROVIDER_SITE_OTHER): Payer: BC Managed Care – PPO | Admitting: Physician Assistant

## 2021-03-02 ENCOUNTER — Other Ambulatory Visit: Payer: Self-pay

## 2021-03-02 VITALS — BP 132/83 | HR 70 | Ht 66.0 in | Wt 183.0 lb

## 2021-03-02 DIAGNOSIS — N3001 Acute cystitis with hematuria: Secondary | ICD-10-CM

## 2021-03-02 DIAGNOSIS — R319 Hematuria, unspecified: Secondary | ICD-10-CM | POA: Diagnosis not present

## 2021-03-02 DIAGNOSIS — L509 Urticaria, unspecified: Secondary | ICD-10-CM

## 2021-03-02 DIAGNOSIS — F17200 Nicotine dependence, unspecified, uncomplicated: Secondary | ICD-10-CM | POA: Diagnosis not present

## 2021-03-02 DIAGNOSIS — F419 Anxiety disorder, unspecified: Secondary | ICD-10-CM | POA: Diagnosis not present

## 2021-03-02 LAB — POCT URINALYSIS DIP (CLINITEK)
Bilirubin, UA: NEGATIVE
Glucose, UA: NEGATIVE mg/dL
Ketones, POC UA: NEGATIVE mg/dL
Nitrite, UA: NEGATIVE
POC PROTEIN,UA: NEGATIVE
Spec Grav, UA: 1.025 (ref 1.010–1.025)
Urobilinogen, UA: 0.2 E.U./dL
pH, UA: 7 (ref 5.0–8.0)

## 2021-03-02 MED ORDER — HYDROXYZINE PAMOATE 25 MG PO CAPS: 25.0000 mg | ORAL_CAPSULE | Freq: Three times a day (TID) | ORAL | 1 refills | Status: DC | PRN

## 2021-03-02 NOTE — Patient Instructions (Signed)
Hives Hives (urticaria) are itchy, red, swollen areas on the skin. Hives can appear on any part of the body. Hives often fade within 24 hours (acute hives). Sometimes, new hives appear after old ones fade and the cycle can continue for several days or weeks (chronic hives). Hives do not spread from person to person (are not contagious). Hives come from the body's reaction to something a person is allergic to (allergen), something that causes irritation, or various other triggers. When a person is exposed to a trigger, his or her body releases a chemical (histamine) that causes redness, itching, and swelling. Hives can appear right after exposure to a trigger or hours later. What are the causes? This condition may be caused by: Allergies to foods or ingredients. Insect bites or stings. Exposure to pollen or pets. Spending time in sunlight, heat, or cold (exposure). Exercise. Stress. You can also get hives from other medical conditions and treatments, such as: Viruses, including the common cold. Bacterial infections, such as urinary tract infections and strep throat. Certain medicines. Contact with latex or chemicals. Allergy shots. Blood transfusions. Sometimes, the cause of this condition is not known (idiopathic hives). What increases the risk? You are more likely to develop this condition if you: Are a woman. Have food allergies, especially to citrus fruits, milk, eggs, peanuts, tree nuts, or shellfish. Are allergic to: Medicines. Latex. Insects. Animals. Pollen. What are the signs or symptoms? Common symptoms of this condition include raised, itchy, red or white bumps or patches on your skin. These areas may: Become large and swollen (welts). Change in shape and location, quickly and repeatedly. Be separate hives or connect over a large area of skin. Sting or become painful. Turn white when pressed in the center (blanch). In severe cases, your hands, feet, and face may also  become swollen. This may occur if hives develop deeper in your skin. How is this diagnosed? This condition may be diagnosed by your symptoms, medical history, and physical exam. Your skin, urine, or blood may be tested to find out what is causing your hives and to rule out other health issues. Your health care provider may also remove a small sample of skin from the affected area and examine it under a microscope (biopsy). How is this treated? Treatment for this condition depends on the cause and severity of your symptoms. Your health care provider may recommend using cool, wet cloths (cool compresses) or taking cool showers to relieve itching. Treatment may include: Medicines that help: Relieve itching (antihistamines). Reduce swelling (corticosteroids). Treat infection (antibiotics). An injectable medicine (omalizumab). Your health care provider may prescribe this if you have chronic idiopathic hives and you continue to have symptoms even after treatment with antihistamines. Severe cases may require an emergency injection of adrenaline (epinephrine) to prevent a life-threatening allergic reaction (anaphylaxis). Follow these instructions at home: Medicines Take and apply over-the-counter and prescription medicines only as told by your health care provider. If you were prescribed an antibiotic medicine, take it as told by your health care provider. Do not stop using the antibiotic even if you start to feel better. Skin care Apply cool compresses to the affected areas. Do not scratch or rub your skin. General instructions Do not take hot showers or baths. This can make itching worse. Do not wear tight-fitting clothing. Use sunscreen and wear protective clothing when you are outside. Avoid any substances that cause your hives. Keep a journal to help track what causes your hives. Write down: What medicines you take.   What you eat and drink. What products you use on your skin. Keep all  follow-up visits as told by your health care provider. This is important. Contact a health care provider if: Your symptoms are not controlled with medicine. Your joints are painful or swollen. Get help right away if: You have a fever. You have pain in your abdomen. Your tongue or lips are swollen. Your eyelids are swollen. Your chest or throat feels tight. You have trouble breathing or swallowing. These symptoms may represent a serious problem that is an emergency. Do not wait to see if the symptoms will go away. Get medical help right away. Call your local emergency services (911 in the [place] Do not drive yourself to the hospital. Summary Hives (urticaria) are itchy, red, swollen areas on your skin. Hives come from the body's reaction to something a person is allergic to (allergen), something that causes irritation, or various other triggers. Treatment for this condition depends on the cause and severity of your symptoms. Avoid any substances that cause your hives. Keep a journal to help track what causes your hives. Take and apply over-the-counter and prescription medicines only as told by your health care provider. Get help right away if your chest or throat feels tight or if you have trouble breathing or swallowing. This information is not intended to replace advice given to you by your health care provider. Make sure you discuss any questions you have with your health care provider. Document Revised: 05/02/2020 Document Reviewed: 05/02/2020 Elsevier Patient Education  [place].

## 2021-03-02 NOTE — Progress Notes (Deleted)
   Subjective:    Patient ID: Lauren Bowers, female    DOB: 06-11-1975, 45 y.o.   MRN: [id number]  HPI   Review of Systems     Objective:   Physical Exam        Assessment & Plan:

## 2021-03-03 LAB — URINALYSIS, MICROSCOPIC ONLY
Bacteria, UA: NONE SEEN /HPF
Hyaline Cast: NONE SEEN /LPF

## 2021-03-04 ENCOUNTER — Encounter: Payer: Self-pay | Admitting: Physician Assistant

## 2021-03-04 DIAGNOSIS — F17200 Nicotine dependence, unspecified, uncomplicated: Secondary | ICD-10-CM | POA: Insufficient documentation

## 2021-03-04 NOTE — Progress Notes (Signed)
No bacteria seen but blood remains. Referral to urology for a further work up.

## 2021-03-04 NOTE — Progress Notes (Signed)
Subjective:    Patient ID: Lauren Bowers, female    DOB: 07-16-1975, 45 y.o.   MRN: [id number]  HPI Pt is a 45 yo female who presents to the clinic to follow up on gastritis and hematuria.   Pt has no UTI symptoms and finished abx. She did have blood in urine on last testing and smoker. I wanted her to follow up on this today.   Her epigastric symptoms have resolved with omeprazole. No melena or hematochezia.   She has been under a lot of stress and anxiety lately and randomly will break out in itchy hives. Just started about 3 days ago. No new medications. Had already stopped abx. Only when she got stress. No trouble breathing.  .. 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   Bilateral hearing loss 03/12/2020   Left thyroid nodule 03/12/2020   Primary osteoarthritis of right knee 04/30/2020   Status post cholecystectomy 01/19/2021   Gastroesophageal reflux disease without esophagitis 01/19/2021   Epigastric pain 01/19/2021   Right upper quadrant pain 01/19/2021   Nausea 01/19/2021   Stress 01/19/2021   Subclinical hyperthyroidism 01/24/2021   Hives 03/02/2021   Hematuria 03/02/2021   Current smoker 03/04/2021   Resolved Ambulatory Problems    Diagnosis Date Noted   Conjunctivitis 02/09/2014   Past Medical History:  Diagnosis Date   Allergy    History of thyroid nodule    Vaginal Pap smear, abnormal    Vitamin D  deficiency      Review of Systems See HPI.     Objective:   Physical Exam Vitals reviewed.  Constitutional:      Appearance: Normal appearance.  HENT:     Head: Normocephalic.  Cardiovascular:     Rate and Rhythm: Normal rate and regular rhythm.     Pulses: Normal pulses.  Pulmonary:     Effort: Pulmonary effort is normal.     Breath sounds: Normal breath sounds.  Skin:    Comments: No rash today.   Neurological:     General: No focal deficit present.     Mental Status: She is alert and oriented to person, place, and time.  Psychiatric:        Mood and Affect: Mood normal.     .. Results for orders placed or performed in visit on 03/02/21  Urine Microscopic  Result Value Ref Range   WBC, UA 6-10 (A) 0 - 5 /HPF   RBC / HPF 3-10 (A) 0 - 2 /HPF   Squamous Epithelial / LPF 6-10 (A) < OR = 5 /HPF   Bacteria, UA NONE SEEN NONE SEEN /HPF   Hyaline Cast NONE SEEN NONE SEEN /LPF   Note    POCT URINALYSIS DIP (CLINITEK)  Result Value Ref Range   Color, UA yellow yellow   Clarity, UA clear clear   Glucose, UA negative negative mg/dL   Bilirubin, UA negative negative   Ketones, POC  UA negative negative mg/dL   Spec Grav, UA 1.025 1.010 - 1.025   Blood, UA small (A) negative   pH, UA 7.0 5.0 - 8.0   POC PROTEIN,UA negative negative, trace   Urobilinogen, UA 0.2 0.2 or 1.0 E.U./dL   Nitrite, UA Negative Negative   Leukocytes, UA Trace (A) Negative        Assessment & Plan:  [name] was seen today for follow-up.  Diagnoses and all orders for this visit:  Hematuria, unspecified type -     POCT URINALYSIS DIP (CLINITEK) -     Urine Microscopic -     Ambulatory referral to Urology  Hives -     hydrOXYzine (VISTARIL) 25 MG capsule; Take 1 capsule (25 mg total) by mouth every 8 (eight) hours as needed.  Anxiety -     hydrOXYzine (VISTARIL) 25 MG capsule; Take 1 capsule (25 mg total) by mouth every 8 (eight) hours as needed.  Current smoker  UTI resolved.  UA  did still show blood and leuks will send off for microscopic.  Pt is smoker and concerned about bladder neoplasms with blood in stool.  If no bacteria will send to urology.   Gastritis resolved. Finish omeprazole and do a trial off medication with only diet management. If symptoms return go back on omeprazole.   Pt continues to smoke but has cut back. On wellbutrin. Discussed this is a huge risk factor and congratulated on the trial of smoking cessation.   Unclear etiology of hives. Only for 3 days and with anxiety. Hydroxizine given. No red flags. Keep a diary of things that happen. HO given. Follow up if persist.

## 2021-03-08 ENCOUNTER — Ambulatory Visit
Admission: EM | Admit: 2021-03-08 | Discharge: 2021-03-08 | Disposition: A | Discharge status: Home / Self Care | Payer: BC Managed Care – PPO | Attending: Emergency Medicine | Admitting: Emergency Medicine

## 2021-03-08 ENCOUNTER — Other Ambulatory Visit: Payer: Self-pay

## 2021-03-08 ENCOUNTER — Telehealth: Payer: Self-pay | Admitting: Physician Assistant

## 2021-03-08 DIAGNOSIS — R300 Dysuria: Secondary | ICD-10-CM | POA: Insufficient documentation

## 2021-03-08 DIAGNOSIS — N390 Urinary tract infection, site not specified: Secondary | ICD-10-CM | POA: Insufficient documentation

## 2021-03-08 LAB — POCT URINALYSIS DIP (MANUAL ENTRY)
Glucose, UA: NEGATIVE mg/dL
Ketones, POC UA: NEGATIVE mg/dL
Nitrite, UA: NEGATIVE
Protein Ur, POC: 30 mg/dL — AB
Spec Grav, UA: >=1.03 — AB (ref 1.010–1.025)
Urobilinogen, UA: 0.2 E.U./dL
pH, UA: 6 (ref 5.0–8.0)

## 2021-03-08 MED ORDER — CEFTRIAXONE SODIUM 1 G IJ SOLR
1.0000 g | Freq: Once | INTRAMUSCULAR | Status: AC
  Administered 2021-03-08: 1 g via INTRAMUSCULAR

## 2021-03-08 MED ORDER — CIPROFLOXACIN HCL 500 MG PO TABS: 500.0000 mg | ORAL_TABLET | Freq: Two times a day (BID) | ORAL | 0 refills | Status: AC

## 2021-03-08 NOTE — ED Triage Notes (Addendum)
Pt reports of having urinary pain and frequency that started today. She denies having other symptoms.

## 2021-03-08 NOTE — ED Provider Notes (Addendum)
[facility]    CSN: [id number] Arrival date & time: 03/08/21  1553    HISTORY  No chief complaint on file.  HPI Lauren Bowers is a 45 y.o. female. Patient reports onset of burning with urination, increased frequency of urination and sensation of incomplete emptying.  Patient states she was diagnosed with a urinary tract infection on February 09, 2021.  EMR reviewed.  Patient was prescribed Pyridium and a 5-day course of Macrobid, urine culture revealed E. coli.  Patient went back for repeat urinalysis March 02, 2021, repeat urine dip revealed red blood cells and white blood cells in her urine, pH did not change, repeat urine culture was not performed.  Patient denies fever, aches, chills, abdominal pain, abdominal pressure, vaginal discharge, flank pain.  The history is provided by the patient.  Past Medical History:  Diagnosis Date   Allergy    Anxiety    Depression    History of thyroid nodule    Vaginal Pap smear, abnormal    Vitamin D deficiency    Patient Active Problem List   Diagnosis Date Noted   Current smoker 03/04/2021   Hives 03/02/2021   Hematuria 03/02/2021   Subclinical hyperthyroidism 01/24/2021   Status post cholecystectomy 01/19/2021   Gastroesophageal reflux disease without esophagitis 01/19/2021   Epigastric pain 01/19/2021   Right upper quadrant pain 01/19/2021   Nausea 01/19/2021   Stress 01/19/2021   Primary osteoarthritis of right knee 04/30/2020   Bilateral hearing loss 03/12/2020   Left thyroid nodule 03/12/2020   Tinnitus of both ears 03/09/2020   Depressed mood 06/26/2018   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   Elevated fasting glucose 03/15/2018   Low TSH level 03/15/2018   Early menopause occurring in patient age younger than 55 years 03/12/2018   Class 1 obesity due to excess calories without serious comorbidity with body mass index (BMI) of 34.0 to 34.9 in adult 03/12/2018   Family history of Charcot-Marie-Tooth  disease 03/12/2018   Neuropathy 03/12/2018   Depression 02/06/2014   Anxiety 02/06/2014   Family planning counseling 02/06/2014   Past Surgical History:  Procedure Laterality Date   CHOLECYSTECTOMY     OB History     Gravida  4   Para  1   Term  1   Preterm  0   AB  3   Living  1      SAB  3   IAB      Ectopic      Multiple      Live Births  1          Home Medications    Prior to Admission medications   Medication Sig Start Date End Date Taking? Authorizing Provider  buPROPion ER [facility] SR) 100 MG 12 hr tablet TAKE 1 TABLET BY MOUTH TWICE A DAY 02/23/21   Breeback, Jade L, PA-C  hydrOXYzine (VISTARIL) 25 MG capsule Take 1 capsule (25 mg total) by mouth every 8 (eight) hours as needed. 03/02/21   Breeback, Luvenia Starch L, PA-C  levOCARNitine (CARNITINE, L,) POWD 500 mg by Does not apply route.    [provider]  norethindrone (MICRONOR) 0.35 MG tablet Take 1 tablet (0.35 mg total) by mouth daily. 03/09/20   Breeback, Jade L, PA-C  omeprazole (PRILOSEC) 40 MG capsule TAKE 1 CAPSULE (40 MG TOTAL) BY MOUTH DAILY. 02/10/21   Breeback, Jade L, PA-C  VITAMIN D PO Take 3,000 Units by mouth.    [provider]   [facility]  History Family History  Problem Relation Age of Onset   Heart disease Father 85       AMI/stenting   Hypertension Father    Hyperlipidemia Father    Multiple sclerosis Father    Charcot-Marie-Tooth disease Father    Ovarian cancer Maternal Grandmother    Heart disease Maternal Grandfather    Cancer Mother        OVARIAN   Stroke Mother    Social History Social History   Tobacco Use   Smoking status: Every Day    Packs/day: 0.50    Years: 20.00    Pack years: 10.00    Types: Cigarettes    Start date: 10/23/1992   Smokeless tobacco: Never  Vaping Use   Vaping Use: Never used  Substance Use Topics   Alcohol use: Yes   Drug use: No   Allergies   Bactrim [sulfamethoxazole-trimethoprim]  Review of Systems Review of  Systems Pertinent findings noted in history of present illness.   Physical Exam Triage Vital Signs ED Triage Vitals  Enc Vitals Group     BP 01/21/21 0827 (!) 147/82     Pulse Rate 01/21/21 0827 72     Resp 01/21/21 0827 18     Temp 01/21/21 0827 98.3 F (36.8 C)     Temp Source 01/21/21 0827 Oral     SpO2 01/21/21 0827 98 %     Weight --      Height --      Head Circumference --      Peak Flow --      Pain Score 01/21/21 0826 5     Pain Loc --      Pain Edu? --      Excl. in [place]? --   No data found.  Updated Vital Signs BP 131/87 (BP Location: Right Arm)    Pulse 67    Temp 98 F (36.7 C) (Oral)    Resp 18    LMP 11/03/2020    SpO2 97%   Physical Exam Vitals and nursing note reviewed.  Constitutional:      General: She is not in acute distress.    Appearance: Normal appearance. She is not ill-appearing.  HENT:     Head: Normocephalic and atraumatic.  Eyes:     General: Lids are normal.        Right eye: No discharge.        Left eye: No discharge.     Extraocular Movements: Extraocular movements intact.     Conjunctiva/sclera: Conjunctivae normal.     Right eye: Right conjunctiva is not injected.     Left eye: Left conjunctiva is not injected.  Neck:     Trachea: Trachea and phonation normal.  Cardiovascular:     Rate and Rhythm: Normal rate and regular rhythm.     Pulses: Normal pulses.     Heart sounds: Normal heart sounds. No murmur heard.   No friction rub. No gallop.  Pulmonary:     Effort: Pulmonary effort is normal. No accessory muscle usage, prolonged expiration or respiratory distress.     Breath sounds: Normal breath sounds. No stridor, decreased air movement or transmitted upper airway sounds. No decreased breath sounds, wheezing, rhonchi or rales.  Chest:     Chest wall: No tenderness.  Abdominal:     General: Abdomen is flat. Bowel sounds are normal. There is no distension.     Palpations: Abdomen is soft.     Tenderness: There is no  abdominal  tenderness. There is no right CVA tenderness or left CVA tenderness.     Hernia: No hernia is present.  Musculoskeletal:        General: Normal range of motion.     Cervical back: Normal range of motion and neck supple. Normal range of motion.  Lymphadenopathy:     Cervical: No cervical adenopathy.  Skin:    General: Skin is warm and dry.     Findings: No erythema or rash.  Neurological:     General: No focal deficit present.     Mental Status: She is alert and oriented to person, place, and time.  Psychiatric:        Mood and Affect: Mood normal.        Behavior: Behavior normal.    Visual Acuity Right Eye Distance:   Left Eye Distance:   Bilateral Distance:    Right Eye Near:   Left Eye Near:    Bilateral Near:     UC Couse / Diagnostics / Procedures:    EKG  Radiology No results found.  Procedures Procedures (including critical care time)  UC Diagnoses / Final Clinical Impressions(s)   I have reviewed the triage vital signs and the nursing notes.  Pertinent labs & imaging results that were available during my care of the patient were reviewed by me and considered in my medical decision making (see chart for details).    Final diagnoses:  Dysuria  Complicated urinary tract infection   Urine dip today was positive for red blood cells and white blood cells, protein, small amount of bilirubin and was cloudy in appearance.  Given patient recently being treated in the past 3 months, I consider this a complicated urinary tract infection and will treat patient empirically with 1000 mg of ceftriaxone here in the office and a 7-day course of ciprofloxacin 500 mg twice daily.  Urine culture will be performed per our protocol.  Patient advised that she will be contacted with results and that adjustments to treatment will be provided as indicated based on the results.  The results be posted to your MyChart account and if there are any abnormal findings that require further  treatment she will be contacted by phone.  ED Prescriptions     Medication Sig Dispense Auth. Provider   ciprofloxacin (CIPRO) 500 MG tablet Take 1 tablet (500 mg total) by mouth every 12 (twelve) hours for 7 days. 14 tablet Lynden Oxford Scales, PA-C      PDMP not reviewed this encounter.  Pending results:  Labs Reviewed  POCT URINALYSIS DIP (MANUAL ENTRY) - Abnormal; Notable for the following components:      Result Value   Clarity, UA cloudy (*)    Bilirubin, UA small (*)    Spec Grav, UA >=1.030 (*)    Blood, UA small (*)    Protein Ur, POC =30 (*)    Leukocytes, UA Trace (*)    All other components within normal limits  URINE CULTURE    Medications Ordered in UC: Medications  cefTRIAXone (ROCEPHIN) injection 1 g (has no administration in time range)    Disposition Upon Discharge:  Condition: stable for discharge home Home: take medications as prescribed; routine discharge instructions as discussed; follow up as advised.  Patient presented with an acute illness with associated systemic symptoms and significant discomfort requiring urgent management. In my opinion, this is a condition that a prudent lay person (someone who possesses an average knowledge of health and medicine) may  potentially expect to result in complications if not addressed urgently such as respiratory distress, impairment of bodily function or dysfunction of bodily organs.   Routine symptom specific, illness specific and/or disease specific instructions were discussed with the patient and/or caregiver at length.   As such, the patient has been evaluated and assessed, work-up was performed and treatment was provided in alignment with urgent care protocols and evidence based medicine.  Patient/parent/caregiver has been advised that the patient may require follow up for further testing and treatment if the symptoms continue in spite of treatment, as clinically indicated and  appropriate.  Patient/parent/caregiver has been advised to return to the [facility] or PCP in 3-5 days if no better; to PCP or the Emergency Department if new signs and symptoms develop, or if the current signs or symptoms continue to change or worsen for further workup, evaluation and treatment as clinically indicated and appropriate  The patient will follow up with their current PCP if and as advised. If the patient does not currently have a PCP we will assist them in obtaining one.   The patient may need specialty follow up if the symptoms continue, in spite of conservative treatment and management, for further workup, evaluation, consultation and treatment as clinically indicated and appropriate.  Patient/parent/caregiver verbalized understanding and agreement of plan as discussed.  All questions were addressed during visit.  Please see discharge instructions below for further details of plan.  Discharge Instructions:   Discharge Instructions      The urinalysis that we performed today was abnormal.  You had been treated for acute urinary tract infection for E. coli in the past 3 months, the infection you have right now is considered complicated.  For this reason, guidelines recommend that you receive an injection of ceftriaxone 1 g on the first day of treatment then begin taking ciprofloxacin 500 mg twice daily for the next 7 days, please try to begin your first dose of ciprofloxacin this evening.  Urine culture will be performed per our protocol.  The results of urine culture should be available in the next 3 to 5 days and will be posted to your MyChart account.  If there are any abnormal results that require further recommendations or treatment, you will be contacted by phone and prescriptions, if any, will be provided for you.  Please follow-up with your primary care provider or with urgent care if your symptoms do not completely resolve in the next 3 to 5 days.  If your symptoms worsen despite  treatment, please report to the emergency room for emergency evaluation and emergency treatment if needed.         Lynden Oxford Scales, PA-C 03/08/21 1735    Lynden Oxford Scales, PA-C 03/08/21 [phone]

## 2021-03-08 NOTE — Discharge Instructions (Addendum)
The urinalysis that we performed today was abnormal.  You had been treated for acute urinary tract infection for E. coli in the past 3 months, the infection you have right now is considered complicated.  For this reason, guidelines recommend that you receive an injection of ceftriaxone 1 g on the first day of treatment then begin taking ciprofloxacin 500 mg twice daily for the next 7 days, please try to begin your first dose of ciprofloxacin this evening.  Urine culture will be performed per our protocol.  The results of urine culture should be available in the next 3 to 5 days and will be posted to your MyChart account.  If there are any abnormal results that require further recommendations or treatment, you will be contacted by phone and prescriptions, if any, will be provided for you.  Please follow-up with your primary care provider or with urgent care if your symptoms do not completely resolve in the next 3 to 5 days.  If your symptoms worsen despite treatment, please report to the emergency room for emergency evaluation and emergency treatment if needed.

## 2021-03-11 LAB — URINE CULTURE: Culture: 40000 — AB

## 2021-03-14 ENCOUNTER — Other Ambulatory Visit: Payer: Self-pay | Admitting: Physician Assistant

## 2021-03-14 DIAGNOSIS — K219 Gastro-esophageal reflux disease without esophagitis: Secondary | ICD-10-CM

## 2021-03-14 DIAGNOSIS — R1013 Epigastric pain: Secondary | ICD-10-CM

## 2021-03-14 DIAGNOSIS — R110 Nausea: Secondary | ICD-10-CM

## 2021-03-18 ENCOUNTER — Telehealth: Payer: Self-pay | Admitting: *Deleted

## 2021-03-18 NOTE — Telephone Encounter (Signed)
Left patient a message to call and schedule annual after 03/27/2021 per patient's request.

## 2021-03-31 ENCOUNTER — Ambulatory Visit
Admission: RE | Admit: 2021-03-31 | Discharge: 2021-03-31 | Disposition: A | Discharge status: Home / Self Care | Payer: BC Managed Care – PPO | Source: Ambulatory Visit | Attending: Physician Assistant | Admitting: Physician Assistant

## 2021-03-31 ENCOUNTER — Other Ambulatory Visit (HOSPITAL_COMMUNITY)
Admission: RE | Admit: 2021-03-31 | Discharge: 2021-03-31 | Disposition: A | Discharge status: Home / Self Care | Payer: BC Managed Care – PPO | Source: Ambulatory Visit | Attending: Physician Assistant | Admitting: Physician Assistant

## 2021-03-31 DIAGNOSIS — D34 Benign neoplasm of thyroid gland: Secondary | ICD-10-CM | POA: Insufficient documentation

## 2021-03-31 DIAGNOSIS — E042 Nontoxic multinodular goiter: Secondary | ICD-10-CM

## 2021-03-31 DIAGNOSIS — E041 Nontoxic single thyroid nodule: Secondary | ICD-10-CM | POA: Diagnosis not present

## 2021-04-01 LAB — CYTOLOGY - NON PAP

## 2021-04-04 NOTE — Progress Notes (Signed)
GREAT news. Benign follicular nodule noted with path report on thyroid nodule.

## 2021-04-07 ENCOUNTER — Other Ambulatory Visit: Payer: Self-pay

## 2021-04-07 ENCOUNTER — Encounter: Payer: Self-pay | Admitting: Obstetrics and Gynecology

## 2021-04-07 ENCOUNTER — Ambulatory Visit (INDEPENDENT_AMBULATORY_CARE_PROVIDER_SITE_OTHER): Payer: BC Managed Care – PPO | Admitting: Obstetrics and Gynecology

## 2021-04-07 VITALS — BP 119/74 | HR 73 | Ht 66.0 in | Wt 186.0 lb

## 2021-04-07 DIAGNOSIS — Z01419 Encounter for gynecological examination (general) (routine) without abnormal findings: Secondary | ICD-10-CM

## 2021-04-07 DIAGNOSIS — Z8041 Family history of malignant neoplasm of ovary: Secondary | ICD-10-CM | POA: Diagnosis not present

## 2021-04-07 NOTE — Progress Notes (Signed)
GYNECOLOGY ANNUAL PREVENTATIVE CARE ENCOUNTER NOTE  History:     Lauren Bowers is a 46 y.o. [phone] female here for a routine annual gynecologic exam.  Current complaints: symptoms of hot flashes and menopause. She is having work up done with ENT due to thyroid and they informed her this could be contributing. .     Reports a FH of breast and ovarian cancer in her mother, grandmother and her [place] aunt. Her mother had ovarian cancer in her 66's.    Had genetic testing through neurology in June 2020. Will obtain those records from [place]. 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     Denies abnormal vaginal bleeding, discharge, pelvic pain, problems with intercourse or other gynecologic concerns.     Gynecologic History No LMP recorded. Patient is premenopausal. Contraception: none Last Pap: 05/2019. Result was normal with normal HPV Last Mammogram: 08/2019.  Result was abnormal - birads 3, had recommended 6 month follow up and pt has not yet been back. MXR ordered by PCP.  Last Colonoscopy: has not yet had  Obstetric History OB History  Gravida Para Term Preterm AB Living  4 1 1  0 3 1  SAB IAB Ectopic Multiple Live Births  3       1    # Outcome Date GA Lbr Len/2nd Weight Sex Delivery Anes PTL Lv  4 Term 2005    F    LIV  3 SAB           2 SAB           1 SAB            Has a daughter.   Past Medical History:  Diagnosis Date   Allergy    Anxiety    Depression    History of thyroid nodule    Vaginal Pap smear, abnormal    Vitamin D deficiency     Past Surgical History:  Procedure Laterality Date   CHOLECYSTECTOMY      Current Outpatient Medications on File Prior to Visit  Medication Sig Dispense Refill   buPROPion ER (WELLBUTRIN SR) 100 MG 12 hr tablet TAKE 1 TABLET BY MOUTH TWICE A DAY 180 tablet 0   hydrOXYzine (VISTARIL) 25 MG capsule Take 1 capsule (25 mg total) by mouth every 8 (eight) hours as needed. 60 capsule 1    levOCARNitine (CARNITINE, L,) POWD 500 mg by Does not apply route.     norethindrone (MICRONOR) 0.35 MG tablet Take 1 tablet (0.35 mg total) by mouth daily. 90 tablet 4   omeprazole (PRILOSEC) 40 MG capsule TAKE 1 CAPSULE (40 MG TOTAL) BY MOUTH DAILY. 90 capsule 1   VITAMIN D PO Take 3,000 Units by mouth.     No current facility-administered medications on file prior to visit.    Allergies  Allergen Reactions   Bactrim [Sulfamethoxazole-Trimethoprim] Hives    Social History:  reports that she has been smoking cigarettes. She started smoking about 28 years ago. She has a 10.00 pack-year smoking history. She has never used smokeless tobacco. She reports current alcohol use. She reports that she does not use drugs.  Family History  Problem Relation Age of Onset   Heart disease Father 21       AMI/stenting   Hypertension Father    Hyperlipidemia Father    Multiple sclerosis Father    Charcot-Marie-Tooth disease Father    Ovarian cancer Maternal Grandmother  Heart disease Maternal Grandfather    Cancer Mother        OVARIAN   Stroke Mother     The following portions of the patient's history were reviewed and updated as appropriate: allergies, current medications, past family history, past medical history, past social history, past surgical history and problem list.  Review of Systems Pertinent items noted in HPI and remainder of comprehensive ROS otherwise negative.  Physical Exam:  There were no vitals taken for this visit. CONSTITUTIONAL: Well-developed, well-nourished female in no acute distress.  HENT:  Normocephalic, atraumatic, External right and left ear normal.  EYES: Conjunctivae and EOM are normal. Pupils are equal, round, and reactive to light. No scleral icterus.  NECK: Normal range of motion, supple, no masses.  Normal thyroid.  SKIN: Skin is warm and dry. No rash noted. Not diaphoretic. No erythema. No pallor. MUSCULOSKELETAL: Normal range of motion. No  tenderness.  No cyanosis, clubbing, or edema. NEUROLOGIC: Alert and oriented to person, place, and time. Normal reflexes, muscle tone coordination.  PSYCHIATRIC: Normal mood and affect. Normal behavior. Normal judgment and thought content.  CARDIOVASCULAR: Normal heart rate noted, regular rhythm RESPIRATORY: Clear to auscultation bilaterally. Effort and breath sounds normal, no problems with respiration noted.  BREASTS: Symmetric in size. No masses, tenderness, skin changes, nipple drainage, or lymphadenopathy bilaterally. Performed in the presence of a chaperone. ABDOMEN: Soft, no distention noted.  No tenderness, rebound or guarding.  PELVIC: External genitalia normal, Vagina normal without discharge, Urethra without abnormality or discharge, no bladder tenderness, cervix normal in appearance, no CMT, uterus normal size, shape, and consistency, no adnexal masses or tenderness. Performed in the presence of a chaperone.  Assessment and Plan:    1. Encounter for annual routine gynecological examination - Cervical cancer screening: Discussed guidelines. Pap with HPV normal on 05/2019 - Gardasil:  Did not have - STD Testing: not indicated - Birth Control: Declines at this time - considering salpingectomy - Breast Health: Encouraged self breast awareness/SBE. Teaching provided. Discussed limits of clinical breast exam for detecting breast cancer. Rx given for MXR by PCP - F/U 12 months and prn  Family history of ovarian cancer (and breast cancer) - Previously counseled on salpingectomy - Discussed option to check again on cost for genetic screening given [facility] of breast and ovarian cancer - Discussed first we will obtain testing from neurology to see what all they tested for. Testing done 08/2018 and is referred. Will do ROI today.  - Discussed different therapeutic options for risk reduction if positive vs negative for gene testing. Discussed HRT and non-HRT options and risk and impact on breast  cancer. Discussed screening options in the absence of genetic testing. The conversation was broad and all-encompassing today but we discussed that once we have more information we can also narrow the focus and recommendations.    Routine preventative health maintenance measures emphasized. Please refer to After Visit Summary for other counseling recommendations.   Radene Gunning, MD, [place] [facility], [place]

## 2021-04-08 ENCOUNTER — Encounter: Payer: Self-pay | Admitting: *Deleted

## 2021-04-26 ENCOUNTER — Other Ambulatory Visit: Payer: Self-pay | Admitting: Physician Assistant

## 2021-04-26 DIAGNOSIS — Z8041 Family history of malignant neoplasm of ovary: Secondary | ICD-10-CM

## 2021-04-26 DIAGNOSIS — Z3009 Encounter for other general counseling and advice on contraception: Secondary | ICD-10-CM

## 2021-04-28 DIAGNOSIS — E042 Nontoxic multinodular goiter: Secondary | ICD-10-CM | POA: Diagnosis not present

## 2021-04-28 DIAGNOSIS — E0590 Thyrotoxicosis, unspecified without thyrotoxic crisis or storm: Secondary | ICD-10-CM | POA: Diagnosis not present

## 2021-05-25 ENCOUNTER — Other Ambulatory Visit: Payer: Self-pay | Admitting: Physician Assistant

## 2021-05-25 DIAGNOSIS — Z716 Tobacco abuse counseling: Secondary | ICD-10-CM

## 2021-07-18 ENCOUNTER — Other Ambulatory Visit: Payer: Self-pay | Admitting: Physician Assistant

## 2021-07-18 DIAGNOSIS — Z3009 Encounter for other general counseling and advice on contraception: Secondary | ICD-10-CM

## 2021-07-18 DIAGNOSIS — Z8041 Family history of malignant neoplasm of ovary: Secondary | ICD-10-CM

## 2021-08-10 DIAGNOSIS — E042 Nontoxic multinodular goiter: Secondary | ICD-10-CM | POA: Diagnosis not present

## 2021-08-10 DIAGNOSIS — E0590 Thyrotoxicosis, unspecified without thyrotoxic crisis or storm: Secondary | ICD-10-CM | POA: Diagnosis not present

## 2021-09-09 DIAGNOSIS — N3001 Acute cystitis with hematuria: Secondary | ICD-10-CM | POA: Diagnosis not present

## 2021-09-09 DIAGNOSIS — R399 Unspecified symptoms and signs involving the genitourinary system: Secondary | ICD-10-CM | POA: Diagnosis not present

## 2021-10-25 ENCOUNTER — Other Ambulatory Visit: Payer: Self-pay | Admitting: Physician Assistant

## 2021-10-25 DIAGNOSIS — Z716 Tobacco abuse counseling: Secondary | ICD-10-CM

## 2021-11-21 ENCOUNTER — Other Ambulatory Visit: Payer: Self-pay | Admitting: Physician Assistant

## 2021-11-21 DIAGNOSIS — Z716 Tobacco abuse counseling: Secondary | ICD-10-CM

## 2021-11-25 DIAGNOSIS — Z882 Allergy status to sulfonamides status: Secondary | ICD-10-CM | POA: Diagnosis not present

## 2021-11-25 DIAGNOSIS — S60221A Contusion of right hand, initial encounter: Secondary | ICD-10-CM | POA: Diagnosis not present

## 2021-11-25 DIAGNOSIS — S6992XA Unspecified injury of left wrist, hand and finger(s), initial encounter: Secondary | ICD-10-CM | POA: Diagnosis not present

## 2021-11-25 DIAGNOSIS — S6991XA Unspecified injury of right wrist, hand and finger(s), initial encounter: Secondary | ICD-10-CM | POA: Diagnosis not present

## 2021-11-25 DIAGNOSIS — S60222A Contusion of left hand, initial encounter: Secondary | ICD-10-CM | POA: Diagnosis not present

## 2021-11-25 DIAGNOSIS — F17210 Nicotine dependence, cigarettes, uncomplicated: Secondary | ICD-10-CM | POA: Diagnosis not present

## 2021-11-25 DIAGNOSIS — W01198A Fall on same level from slipping, tripping and stumbling with subsequent striking against other object, initial encounter: Secondary | ICD-10-CM | POA: Diagnosis not present

## 2021-11-25 DIAGNOSIS — M25539 Pain in unspecified wrist: Secondary | ICD-10-CM | POA: Diagnosis not present

## 2021-11-29 ENCOUNTER — Telehealth: Payer: Self-pay | Admitting: Neurology

## 2021-11-29 NOTE — Telephone Encounter (Signed)
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 Please advise. [phone]

## 2021-11-30 NOTE — Telephone Encounter (Signed)
Patient made aware this is best managed by Neurology.

## 2021-12-02 ENCOUNTER — Other Ambulatory Visit: Payer: Self-pay | Admitting: Physician Assistant

## 2021-12-02 DIAGNOSIS — G600 Hereditary motor and sensory neuropathy: Secondary | ICD-10-CM | POA: Diagnosis not present

## 2021-12-02 DIAGNOSIS — Z716 Tobacco abuse counseling: Secondary | ICD-10-CM

## 2021-12-25 DIAGNOSIS — R300 Dysuria: Secondary | ICD-10-CM | POA: Diagnosis not present

## 2022-01-02 ENCOUNTER — Encounter: Payer: Self-pay | Admitting: Obstetrics and Gynecology

## 2022-01-03 ENCOUNTER — Other Ambulatory Visit: Payer: Self-pay | Admitting: Physician Assistant

## 2022-01-03 DIAGNOSIS — Z8041 Family history of malignant neoplasm of ovary: Secondary | ICD-10-CM

## 2022-01-03 DIAGNOSIS — M25632 Stiffness of left wrist, not elsewhere classified: Secondary | ICD-10-CM | POA: Diagnosis not present

## 2022-01-03 DIAGNOSIS — Z3009 Encounter for other general counseling and advice on contraception: Secondary | ICD-10-CM

## 2022-01-03 DIAGNOSIS — M25631 Stiffness of right wrist, not elsewhere classified: Secondary | ICD-10-CM | POA: Diagnosis not present

## 2022-01-03 DIAGNOSIS — M25532 Pain in left wrist: Secondary | ICD-10-CM | POA: Diagnosis not present

## 2022-01-03 DIAGNOSIS — M25531 Pain in right wrist: Secondary | ICD-10-CM | POA: Diagnosis not present

## 2022-01-09 ENCOUNTER — Other Ambulatory Visit: Payer: Self-pay

## 2022-01-09 DIAGNOSIS — N926 Irregular menstruation, unspecified: Secondary | ICD-10-CM

## 2022-01-09 NOTE — Progress Notes (Signed)
[place] ordered per Dr.Duncan

## 2022-01-11 DIAGNOSIS — N926 Irregular menstruation, unspecified: Secondary | ICD-10-CM | POA: Diagnosis not present

## 2022-01-11 DIAGNOSIS — M25531 Pain in right wrist: Secondary | ICD-10-CM | POA: Diagnosis not present

## 2022-01-11 DIAGNOSIS — M25631 Stiffness of right wrist, not elsewhere classified: Secondary | ICD-10-CM | POA: Diagnosis not present

## 2022-01-11 DIAGNOSIS — M25532 Pain in left wrist: Secondary | ICD-10-CM | POA: Diagnosis not present

## 2022-01-11 DIAGNOSIS — M25632 Stiffness of left wrist, not elsewhere classified: Secondary | ICD-10-CM | POA: Diagnosis not present

## 2022-01-12 LAB — FOLLICLE STIMULATING HORMONE: FSH: 42.2 m[IU]/mL

## 2022-01-13 DIAGNOSIS — G600 Hereditary motor and sensory neuropathy: Secondary | ICD-10-CM | POA: Diagnosis not present

## 2022-01-31 ENCOUNTER — Encounter: Payer: Self-pay | Admitting: Physician Assistant

## 2022-01-31 ENCOUNTER — Ambulatory Visit (INDEPENDENT_AMBULATORY_CARE_PROVIDER_SITE_OTHER): Payer: BC Managed Care – PPO | Admitting: Physician Assistant

## 2022-01-31 VITALS — BP 121/83 | HR 74 | Ht 66.0 in | Wt 177.1 lb

## 2022-01-31 DIAGNOSIS — G479 Sleep disorder, unspecified: Secondary | ICD-10-CM

## 2022-01-31 DIAGNOSIS — R7301 Impaired fasting glucose: Secondary | ICD-10-CM

## 2022-01-31 DIAGNOSIS — R7989 Other specified abnormal findings of blood chemistry: Secondary | ICD-10-CM

## 2022-01-31 DIAGNOSIS — G600 Hereditary motor and sensory neuropathy: Secondary | ICD-10-CM

## 2022-01-31 DIAGNOSIS — Z1211 Encounter for screening for malignant neoplasm of colon: Secondary | ICD-10-CM

## 2022-01-31 DIAGNOSIS — Z0000 Encounter for general adult medical examination without abnormal findings: Secondary | ICD-10-CM

## 2022-01-31 DIAGNOSIS — R4589 Other symptoms and signs involving emotional state: Secondary | ICD-10-CM

## 2022-01-31 DIAGNOSIS — F419 Anxiety disorder, unspecified: Secondary | ICD-10-CM

## 2022-01-31 DIAGNOSIS — Z13220 Encounter for screening for lipoid disorders: Secondary | ICD-10-CM

## 2022-01-31 MED ORDER — HYDROXYZINE PAMOATE 25 MG PO CAPS: 25.0000 mg | ORAL_CAPSULE | Freq: Every evening | ORAL | 1 refills | Status: DC | PRN

## 2022-01-31 NOTE — Progress Notes (Unsigned)
Complete physical exam  Patient: Lauren Bowers   DOB: [birth year]   46 y.o. Female  MRN: [id number]  Subjective:    Chief Complaint  Patient presents with   Annual Exam    Lauren Bowers is a 46 y.o. female who presents today for a complete physical exam. She reports consuming a general diet. The patient does not participate in regular exercise at present. She generally feels fairly well. She reports sleeping poorly. She does have additional problems to discuss today.   Her CMT is progressing. She did fall in grocery store about 3 months ago and had severe sprain of both arms and out of work for a while. Her bilateral feet/ankles/legs are getting weaker. She has really been struggling with the reality of her disease.   Most recent fall risk assessment:    01/31/2022    4:07 PM  [place] in the past year? 1  Number falls in past yr: 0  Injury with Fall? 1  Risk for fall due to : Impaired balance/gait;Impaired mobility;History of fall(s);Other (Comment)  Follow up Falls evaluation completed;Education provided;Falls prevention discussed     Most recent depression screenings:    01/31/2022    3:45 PM 01/26/2021    3:19 PM  PHQ 2/9 Scores  PHQ - 2 Score 1 4  PHQ- 9 Score 14 21    Vision:Within last year and Dental: No current dental problems  Patient Active Problem List   Diagnosis Date Noted   Current smoker 03/04/2021   Hives 03/02/2021   Hematuria 03/02/2021   Subclinical hyperthyroidism 01/24/2021   Status post cholecystectomy 01/19/2021   Gastroesophageal reflux disease without esophagitis 01/19/2021   Epigastric pain 01/19/2021   Right upper quadrant pain 01/19/2021   Nausea 01/19/2021   Stress 01/19/2021   Primary osteoarthritis of right knee 04/30/2020   Bilateral hearing loss 03/12/2020   Left thyroid nodule 03/12/2020   Tinnitus of both ears 03/09/2020   Depressed mood 06/26/2018   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   Elevated fasting  glucose 03/15/2018   Low TSH level 03/15/2018   Early menopause occurring in patient age younger than 31 years 03/12/2018   Class 1 obesity due to excess calories without serious comorbidity with body mass index (BMI) of 34.0 to 34.9 in adult 03/12/2018   Family history of Charcot-Marie-Tooth disease 03/12/2018   Neuropathy 03/12/2018   Depression 02/06/2014   Anxiety 02/06/2014   Family planning counseling 02/06/2014   Past Medical History:  Diagnosis Date   Allergy    Anxiety    Depression    History of thyroid nodule    Vaginal Pap smear, abnormal    Vitamin D deficiency    Family History  Problem Relation Age of Onset   Heart disease Father 69       AMI/stenting   Hypertension Father    Hyperlipidemia Father    Multiple sclerosis Father    Charcot-Marie-Tooth disease Father    Ovarian cancer Maternal Grandmother    Heart disease Maternal Grandfather    Cancer Mother        OVARIAN   Stroke Mother    Allergies  Allergen Reactions   Bactrim [Sulfamethoxazole-Trimethoprim] Hives      Patient Care Team: Lavada Mesi as PCP - General (Family Medicine)   Outpatient Medications Prior to Visit  Medication Sig   Cholecalciferol (VITAMIN D3) 100000 UNIT/GM POWD Take by mouth.   levOCARNitine (CARNITINE, L,) POWD 500 mg by Does  not apply route.   methimazole (TAPAZOLE) 5 MG tablet Take 2.5 mg by mouth daily.   VITAMIN D PO Take 3,000 Units by mouth.   [DISCONTINUED] buPROPion ER (WELLBUTRIN SR) 100 MG 12 hr tablet TAKE 1 TABLET (100 MG TOTAL) BY MOUTH 2 (TWO) TIMES DAILY. APPT FOR REFILLS   [DISCONTINUED] hydrOXYzine (VISTARIL) 25 MG capsule Take 1 capsule (25 mg total) by mouth every 8 (eight) hours as needed.   [DISCONTINUED] norethindrone (MICRONOR) 0.35 MG tablet TAKE 1 TABLET BY MOUTH EVERY DAY   [DISCONTINUED] omeprazole (PRILOSEC) 40 MG capsule TAKE 1 CAPSULE (40 MG TOTAL) BY MOUTH DAILY.   No facility-administered medications prior to visit.     ROS   See HPI.      Objective:     BP 121/83   Pulse 74   Ht '5\' 6"'$  (1.676 m)   Wt 177 lb 1.3 oz (80.3 kg)   SpO2 99%   BMI 28.58 kg/m  BP Readings from Last 3 Encounters:  01/31/22 121/83  04/07/21 119/74  03/08/21 131/87   Wt Readings from Last 3 Encounters:  01/31/22 177 lb 1.3 oz (80.3 kg)  04/07/21 186 lb (84.4 kg)  03/02/21 183 lb (83 kg)   SpO2 Readings from Last 3 Encounters:  01/31/22 99%  03/08/21 97%  03/02/21 98%      Physical Exam BP 121/83   Pulse 74   Ht '5\' 6"'$  (1.676 m)   Wt 177 lb 1.3 oz (80.3 kg)   SpO2 99%   BMI 28.58 kg/m   General Appearance:    Alert, cooperative, no distress, appears stated age  Head:    Normocephalic, without obvious abnormality, atraumatic  Eyes:    PERRL, conjunctiva/corneas clear, EOM's intact, fundi    benign, both eyes  Ears:    Normal TM's and external ear canals, both ears  Nose:   Nares normal, septum midline, mucosa normal, no drainage    or sinus tenderness  Throat:   Lips, mucosa, and tongue normal; teeth and gums normal  Neck:   Supple, symmetrical, trachea midline, no adenopathy;    thyroid:  no enlargement/tenderness/nodules; no carotid   bruit or JVD  Back:     Symmetric, no curvature, ROM normal, no CVA tenderness  Lungs:     Clear to auscultation bilaterally, respirations unlabored  Chest Wall:    No tenderness or deformity   Heart:    Regular rate and rhythm, S1 and S2 normal, no murmur, rub   or gallop     Abdomen:     Soft, non-tender, bowel sounds active all four quadrants,    no masses, no organomegaly        Extremities:   Extremities normal, atraumatic, no cyanosis or edema  Pulses:   2+ and symmetric all extremities  Skin:   Skin color, texture, turgor normal, no rashes or lesions  Lymph nodes:   Cervical, supraclavicular, and axillary nodes normal  Neurologic:   Strength decreased in bilateral feet 2/5, legs 3/5. Hand grip normal      Assessment & Plan:    Routine Health  Maintenance and Physical Exam  Immunization History  Administered Date(s) Administered   Janssen (J&J) SARS-COV-2 Vaccination 07/29/2020    Health Maintenance  Topic Date Due   TETANUS/TDAP  Never done   COLONOSCOPY (Pts 45-52yr Insurance coverage will need to be confirmed)  Never done   MAMMOGRAM  07/02/2020   COVID-19 Vaccine (2 - Booster for [employer]) 02/16/2022 (Originally 09/23/2020)  INFLUENZA VACCINE  06/25/2022 (Originally 10/25/2021)   PAP SMEAR-Modifier  06/19/2022   Hepatitis C Screening  Completed   HIV Screening  Completed   HPV VACCINES  Aged Out    Discussed health benefits of physical activity, and encouraged her to engage in regular exercise appropriate for her age and condition.  Marland KitchenIzola Price was seen today for annual exam.  Diagnoses and all orders for this visit:  Physical exam, routine -     TSH -     Lipid Panel w/reflex Direct LDL -     COMPLETE METABOLIC PANEL WITH GFR -     CBC with Differential/Platelet -     Hemoglobin A1c  Low TSH level -     TSH  Elevated fasting glucose -     COMPLETE METABOLIC PANEL WITH GFR -     Hemoglobin A1c  Screening for lipid disorders -     Lipid Panel w/reflex Direct LDL  Trouble in sleeping -     hydrOXYzine (VISTARIL) 25 MG capsule; Take 1 capsule (25 mg total) by mouth at bedtime as needed.  Colon cancer screening -     Ambulatory referral to Gastroenterology  CMT (Charcot-Marie-Tooth disease)  Anxiety  Depressed mood   .Marland Kitchen Discussed 150 minutes of exercise a week.  Encouraged vitamin D 1000 units and Calcium '1300mg'$  or 4 servings of dairy a day.  PHQ/GAD were up some this year Discussed counseling for CMT and her recent complications, she will ask neurology about counselors with CMT knowledge.  She declines for now Colonoscopy ordered Pap UTD. Mammogram she needs diagnostic mammogram and ultrasound at breast clinic. Discussed with patient to call and schedule.  Declined covid, flu, tdap  vaccines today.   She is having some trouble sleeping. Mostly going to sleep.  Trial of hydroxyzine at bedtime.  Follow up in 3-6 months.       Iran Planas, PA-C

## 2022-01-31 NOTE — Patient Instructions (Signed)

## 2022-02-01 ENCOUNTER — Encounter: Payer: Self-pay | Admitting: Physician Assistant

## 2022-02-01 DIAGNOSIS — M25631 Stiffness of right wrist, not elsewhere classified: Secondary | ICD-10-CM | POA: Diagnosis not present

## 2022-02-01 DIAGNOSIS — M25532 Pain in left wrist: Secondary | ICD-10-CM | POA: Diagnosis not present

## 2022-02-01 DIAGNOSIS — M25531 Pain in right wrist: Secondary | ICD-10-CM | POA: Diagnosis not present

## 2022-02-01 DIAGNOSIS — M25632 Stiffness of left wrist, not elsewhere classified: Secondary | ICD-10-CM | POA: Diagnosis not present

## 2022-02-01 DIAGNOSIS — R7301 Impaired fasting glucose: Secondary | ICD-10-CM | POA: Diagnosis not present

## 2022-02-01 DIAGNOSIS — R7989 Other specified abnormal findings of blood chemistry: Secondary | ICD-10-CM | POA: Diagnosis not present

## 2022-02-01 DIAGNOSIS — Z0000 Encounter for general adult medical examination without abnormal findings: Secondary | ICD-10-CM | POA: Diagnosis not present

## 2022-02-01 DIAGNOSIS — Z13220 Encounter for screening for lipoid disorders: Secondary | ICD-10-CM | POA: Diagnosis not present

## 2022-02-02 LAB — LIPID PANEL W/REFLEX DIRECT LDL
Cholesterol: 160 mg/dL (ref <200)
HDL: 55 mg/dL (ref >=50)
LDL Cholesterol (Calc): 83 mg/dL (calc)
Non-HDL Cholesterol (Calc): 105 mg/dL (calc) (ref <130)
Total CHOL/HDL Ratio: 2.9 (calc) (ref <5.0)
Triglycerides: 120 mg/dL (ref <150)

## 2022-02-02 LAB — COMPLETE METABOLIC PANEL WITH GFR
AG Ratio: 1.7 (calc) (ref 1.0–2.5)
ALT: 10 U/L (ref 6–29)
AST: 14 U/L (ref 10–35)
Albumin: 4.4 g/dL (ref 3.6–5.1)
Alkaline phosphatase (APISO): 82 U/L (ref 31–125)
BUN: 14 mg/dL (ref 7–25)
CO2: 27 mmol/L (ref 20–32)
Calcium: 9.7 mg/dL (ref 8.6–10.2)
Chloride: 104 mmol/L (ref 98–110)
Creat: 0.57 mg/dL (ref 0.50–0.99)
Globulin: 2.6 g/dL (calc) (ref 1.9–3.7)
Glucose, Bld: 97 mg/dL (ref 65–99)
Potassium: 4.5 mmol/L (ref 3.5–5.3)
Sodium: 139 mmol/L (ref 135–146)
Total Bilirubin: 0.4 mg/dL (ref 0.2–1.2)
Total Protein: 7 g/dL (ref 6.1–8.1)
eGFR: 112 mL/min/{1.73_m2} (ref >=60)

## 2022-02-02 LAB — CBC WITH DIFFERENTIAL/PLATELET
Absolute Monocytes: 543 cells/uL (ref 200–950)
Basophils Absolute: 73 cells/uL (ref 0–200)
Basophils Relative: 0.9 %
Eosinophils Absolute: 154 cells/uL (ref 15–500)
Eosinophils Relative: 1.9 %
HCT: 45.2 % — ABNORMAL HIGH (ref 35.0–45.0)
Hemoglobin: 15.4 g/dL (ref 11.7–15.5)
Lymphs Abs: 2276 cells/uL (ref 850–3900)
MCH: 31.6 pg (ref 27.0–33.0)
MCHC: 34.1 g/dL (ref 32.0–36.0)
MCV: 92.6 fL (ref 80.0–100.0)
MPV: 10.5 fL (ref 7.5–12.5)
Monocytes Relative: 6.7 %
Neutro Abs: 5054 cells/uL (ref 1500–7800)
Neutrophils Relative %: 62.4 %
Platelets: 292 10*3/uL (ref 140–400)
RBC: 4.88 10*6/uL (ref 3.80–5.10)
RDW: 12.2 % (ref 11.0–15.0)
Total Lymphocyte: 28.1 %
WBC: 8.1 10*3/uL (ref 3.8–10.8)

## 2022-02-02 LAB — TSH: TSH: 1.44 mIU/L

## 2022-02-02 LAB — HEMOGLOBIN A1C
Hgb A1c MFr Bld: 5.6 % of total Hgb (ref <5.7)
Mean Plasma Glucose: 114 mg/dL
eAG (mmol/L): 6.3 mmol/L

## 2022-02-03 NOTE — Progress Notes (Signed)
Lauren Bowers,   Thyroid looks great.  Cholesterol looks great.  Kidney, liver, glucose looks good.  A1C normal range and better than last year.  Hemoglobin and hematocrit up some. Are you taking any iron? Stop if you are.

## 2022-02-14 DIAGNOSIS — E0590 Thyrotoxicosis, unspecified without thyrotoxic crisis or storm: Secondary | ICD-10-CM | POA: Diagnosis not present

## 2022-02-14 DIAGNOSIS — E042 Nontoxic multinodular goiter: Secondary | ICD-10-CM | POA: Diagnosis not present

## 2022-03-01 DIAGNOSIS — M25532 Pain in left wrist: Secondary | ICD-10-CM | POA: Diagnosis not present

## 2022-03-01 DIAGNOSIS — G600 Hereditary motor and sensory neuropathy: Secondary | ICD-10-CM | POA: Diagnosis not present

## 2022-03-01 DIAGNOSIS — M25631 Stiffness of right wrist, not elsewhere classified: Secondary | ICD-10-CM | POA: Diagnosis not present

## 2022-03-01 DIAGNOSIS — M25531 Pain in right wrist: Secondary | ICD-10-CM | POA: Diagnosis not present

## 2022-03-15 DIAGNOSIS — M25631 Stiffness of right wrist, not elsewhere classified: Secondary | ICD-10-CM | POA: Diagnosis not present

## 2022-03-15 DIAGNOSIS — G600 Hereditary motor and sensory neuropathy: Secondary | ICD-10-CM | POA: Diagnosis not present

## 2022-03-15 DIAGNOSIS — M25531 Pain in right wrist: Secondary | ICD-10-CM | POA: Diagnosis not present

## 2022-03-15 DIAGNOSIS — M25532 Pain in left wrist: Secondary | ICD-10-CM | POA: Diagnosis not present

## 2022-03-22 DIAGNOSIS — M25532 Pain in left wrist: Secondary | ICD-10-CM | POA: Diagnosis not present

## 2022-03-22 DIAGNOSIS — G600 Hereditary motor and sensory neuropathy: Secondary | ICD-10-CM | POA: Diagnosis not present

## 2022-03-22 DIAGNOSIS — M25631 Stiffness of right wrist, not elsewhere classified: Secondary | ICD-10-CM | POA: Diagnosis not present

## 2022-03-22 DIAGNOSIS — M25531 Pain in right wrist: Secondary | ICD-10-CM | POA: Diagnosis not present

## 2022-05-02 NOTE — Progress Notes (Unsigned)
ANNUAL EXAM Patient name: Lauren Bowers MRN [id number]  Date of birth: 03-Sep-1975 Chief Complaint:   No chief complaint on file.  History of Present Illness:   Lauren Bowers is a 47 y.o. [phone] female being seen today for a routine annual exam.   Current complaints: ***  No LMP recorded. Patient is premenopausal.  Current birth control: ***  Last pap: 05/2019. Result was normal with normal HPV Last Mammogram: 08/2019.  Result was abnormal - birads 3, had recommended 6 month follow up and pt has not yet been back. MXR ordered by PCP.  Last Colonoscopy: has not yet had  Health Maintenance Due  Topic Date Due   DTaP/Tdap/Td (1 - Tdap) Never done   COLONOSCOPY (Pts 45-48yr Insurance coverage will need to be confirmed)  Never done   MAMMOGRAM  07/02/2020   COVID-19 Vaccine (2 - 2023-24 season) 11/25/2021   PAP SMEAR-Modifier  06/19/2022    Review of Systems:   Pertinent items are noted in HPI Denies any headaches, blurred vision, fatigue, shortness of breath, chest pain, abdominal pain, abnormal vaginal discharge/itching/odor/irritation, problems with periods, bowel movements, urination, or intercourse unless otherwise stated above. *** Pertinent History Reviewed:  Reviewed past medical,surgical, social and family history.  Reviewed problem list, medications and allergies. Physical Assessment:  There were no vitals filed for this visit.There is no height or weight on file to calculate BMI.   Physical Examination:  General appearance - well appearing, and in no distress Mental status - alert, oriented to person, place, and time Psych:  She has a normal mood and affect Skin - warm and dry, normal color, no suspicious lesions noted Chest - effort normal Heart - normal rate  Breasts - breasts appear normal, no suspicious masses, no skin or nipple changes or axillary nodes Abdomen - soft, nontender, nondistended, no masses or organomegaly Pelvic -  VULVA: normal appearing vulva  with no masses, tenderness or lesions  VAGINA: normal appearing vagina with normal color and discharge, no lesions  CERVIX: normal appearing cervix without discharge or lesions, no CMT UTERUS: uterus is felt to be normal size, shape, consistency and nontender  ADNEXA: No adnexal masses or tenderness noted. Extremities:  No swelling or varicosities noted  Chaperone present for exam  No results found for this or any previous visit (from the past 24 hour(s)).  Assessment & Plan:  Diagnoses and all orders for this visit:  Encounter for annual routine gynecological examination  Family history of ovarian cancer  - Cervical cancer screening: Discussed guidelines. Pap with HPV normal on 05/2019 - Gardasil:  Did not have - STD Testing: not indicated - Birth Control: Declines at this time - considering salpingectomy - Breast Health: Encouraged self breast awareness/SBE. Teaching provided. Discussed limits of clinical breast exam for detecting breast cancer. Rx given for MXR by PCP - F/U 12 months and prn  2023: - Previously counseled on salpingectomy - Discussed option to check again on cost for genetic screening [facility] - Discussed first we will obtain testing from neurology to see what all they tested for. Testing done 08/2018 and is referred. Will do ROI today.  - Discussed different therapeutic options for risk reduction if positive vs negative for gene testing. Discussed HRT and non-HRT options and risk and impact on breast cancer. Discussed screening options in the absence of genetic testing. The conversation was broad and all-encompassing today but we discussed that once we have more information we can  also narrow the focus and recommendations.   No orders of the defined types were placed in this encounter.   Meds: No orders of the defined types were placed in this encounter.   Follow-up: No follow-ups on file.  Radene Gunning, MD 05/02/2022 3:59 PM

## 2022-05-03 ENCOUNTER — Ambulatory Visit (INDEPENDENT_AMBULATORY_CARE_PROVIDER_SITE_OTHER): Payer: BC Managed Care – PPO | Admitting: Obstetrics and Gynecology

## 2022-05-03 ENCOUNTER — Other Ambulatory Visit (HOSPITAL_COMMUNITY)
Admission: RE | Admit: 2022-05-03 | Discharge: 2022-05-03 | Disposition: A | Discharge status: Home / Self Care | Payer: BC Managed Care – PPO | Source: Ambulatory Visit | Attending: Obstetrics and Gynecology | Admitting: Obstetrics and Gynecology

## 2022-05-03 ENCOUNTER — Encounter: Payer: Self-pay | Admitting: Obstetrics and Gynecology

## 2022-05-03 VITALS — BP 115/74 | HR 76 | Ht 66.0 in | Wt 187.0 lb

## 2022-05-03 DIAGNOSIS — Z01419 Encounter for gynecological examination (general) (routine) without abnormal findings: Secondary | ICD-10-CM | POA: Insufficient documentation

## 2022-05-03 DIAGNOSIS — Z8041 Family history of malignant neoplasm of ovary: Secondary | ICD-10-CM

## 2022-05-08 ENCOUNTER — Telehealth: Payer: Self-pay | Admitting: *Deleted

## 2022-05-08 LAB — CYTOLOGY - PAP
Comment: NEGATIVE
Diagnosis: NEGATIVE
High risk HPV: NEGATIVE

## 2022-05-08 NOTE — Telephone Encounter (Signed)
Returned call from 10:48 AM. Patient given number to Patient Accounting to update insurance information for office visit on 05/03/2022. Office was not able to add insurance due to billing statement already being sent out.

## 2022-07-18 DIAGNOSIS — Z1330 Encounter for screening examination for mental health and behavioral disorders, unspecified: Secondary | ICD-10-CM | POA: Diagnosis not present

## 2022-07-18 DIAGNOSIS — G600 Hereditary motor and sensory neuropathy: Secondary | ICD-10-CM | POA: Diagnosis not present

## 2022-07-25 ENCOUNTER — Other Ambulatory Visit: Payer: Self-pay | Admitting: Physician Assistant

## 2022-07-25 DIAGNOSIS — G479 Sleep disorder, unspecified: Secondary | ICD-10-CM

## 2022-08-15 DIAGNOSIS — E042 Nontoxic multinodular goiter: Secondary | ICD-10-CM | POA: Diagnosis not present

## 2022-08-15 DIAGNOSIS — N951 Menopausal and female climacteric states: Secondary | ICD-10-CM | POA: Diagnosis not present

## 2022-08-15 DIAGNOSIS — E0590 Thyrotoxicosis, unspecified without thyrotoxic crisis or storm: Secondary | ICD-10-CM | POA: Diagnosis not present

## 2022-08-30 DIAGNOSIS — S0502XA Injury of conjunctiva and corneal abrasion without foreign body, left eye, initial encounter: Secondary | ICD-10-CM | POA: Diagnosis not present

## 2022-10-24 DIAGNOSIS — M21371 Foot drop, right foot: Secondary | ICD-10-CM | POA: Diagnosis not present

## 2022-10-24 DIAGNOSIS — M21372 Foot drop, left foot: Secondary | ICD-10-CM | POA: Diagnosis not present

## 2022-10-24 DIAGNOSIS — G600 Hereditary motor and sensory neuropathy: Secondary | ICD-10-CM | POA: Diagnosis not present

## 2022-10-24 DIAGNOSIS — Q6672 Congenital pes cavus, left foot: Secondary | ICD-10-CM | POA: Diagnosis not present

## 2022-11-07 DIAGNOSIS — L400 Psoriasis vulgaris: Secondary | ICD-10-CM | POA: Diagnosis not present

## 2022-12-07 DIAGNOSIS — L309 Dermatitis, unspecified: Secondary | ICD-10-CM | POA: Diagnosis not present

## 2022-12-07 DIAGNOSIS — D485 Neoplasm of uncertain behavior of skin: Secondary | ICD-10-CM | POA: Diagnosis not present

## 2022-12-13 DIAGNOSIS — L988 Other specified disorders of the skin and subcutaneous tissue: Secondary | ICD-10-CM | POA: Diagnosis not present

## 2022-12-27 DIAGNOSIS — G600 Hereditary motor and sensory neuropathy: Secondary | ICD-10-CM | POA: Diagnosis not present

## 2023-01-17 ENCOUNTER — Ambulatory Visit (INDEPENDENT_AMBULATORY_CARE_PROVIDER_SITE_OTHER): Payer: BC Managed Care – PPO | Admitting: Physician Assistant

## 2023-01-17 VITALS — BP 128/78 | HR 74 | Ht 66.0 in | Wt 187.0 lb

## 2023-01-17 DIAGNOSIS — G600 Hereditary motor and sensory neuropathy: Secondary | ICD-10-CM | POA: Diagnosis not present

## 2023-01-17 DIAGNOSIS — R7301 Impaired fasting glucose: Secondary | ICD-10-CM | POA: Diagnosis not present

## 2023-01-17 DIAGNOSIS — E042 Nontoxic multinodular goiter: Secondary | ICD-10-CM

## 2023-01-17 DIAGNOSIS — Z79899 Other long term (current) drug therapy: Secondary | ICD-10-CM

## 2023-01-17 DIAGNOSIS — Z0000 Encounter for general adult medical examination without abnormal findings: Secondary | ICD-10-CM

## 2023-01-17 DIAGNOSIS — Z23 Encounter for immunization: Secondary | ICD-10-CM | POA: Diagnosis not present

## 2023-01-17 DIAGNOSIS — Z1211 Encounter for screening for malignant neoplasm of colon: Secondary | ICD-10-CM

## 2023-01-17 DIAGNOSIS — Z131 Encounter for screening for diabetes mellitus: Secondary | ICD-10-CM

## 2023-01-17 DIAGNOSIS — Z716 Tobacco abuse counseling: Secondary | ICD-10-CM

## 2023-01-17 DIAGNOSIS — E0590 Thyrotoxicosis, unspecified without thyrotoxic crisis or storm: Secondary | ICD-10-CM | POA: Diagnosis not present

## 2023-01-17 DIAGNOSIS — Z1231 Encounter for screening mammogram for malignant neoplasm of breast: Secondary | ICD-10-CM

## 2023-01-17 DIAGNOSIS — Z13220 Encounter for screening for lipoid disorders: Secondary | ICD-10-CM

## 2023-01-17 MED ORDER — VARENICLINE TARTRATE 1 MG PO TABS: 1.0000 mg | ORAL_TABLET | Freq: Two times a day (BID) | ORAL | 3 refills | Status: DC

## 2023-01-17 MED ORDER — VARENICLINE TARTRATE (STARTER) 0.5 MG X 11 & 1 MG X 42 PO TBPK: ORAL_TABLET | ORAL | 0 refills | Status: DC

## 2023-01-17 NOTE — Patient Instructions (Signed)

## 2023-01-19 ENCOUNTER — Encounter: Payer: Self-pay | Admitting: Physician Assistant

## 2023-01-19 DIAGNOSIS — Z79899 Other long term (current) drug therapy: Secondary | ICD-10-CM | POA: Diagnosis not present

## 2023-01-19 DIAGNOSIS — Z23 Encounter for immunization: Secondary | ICD-10-CM | POA: Diagnosis not present

## 2023-01-19 DIAGNOSIS — Z0000 Encounter for general adult medical examination without abnormal findings: Secondary | ICD-10-CM

## 2023-01-19 NOTE — Progress Notes (Signed)
Complete physical exam  Patient: Lauren Bowers   DOB: [birth year]   47 y.o. Female  MRN: [id number]  Subjective:    Chief Complaint  Patient presents with   Annual Exam    Not fasting for labs     Lauren Bowers is a 47 y.o. female who presents today for a complete physical exam. She reports consuming a general diet.  She is going to the gym for strength training and walking regularly. She does wear her bilateral leg braces when walking for exercise.   She generally feels fairly well. She reports sleeping well. She does not have additional problems to discuss today.    Most recent fall risk assessment:    01/19/2023    7:50 AM  Fall Risk   Falls in the past year? 1  Number falls in past yr: 1  Injury with Fall? 1  Risk for fall due to : Impaired balance/gait;Impaired mobility  Follow up Falls evaluation completed;Education provided;Falls prevention discussed     Most recent depression screenings:    01/19/2023    7:51 AM 01/31/2022    3:45 PM  PHQ 2/9 Scores  PHQ - 2 Score 0 1  PHQ- 9 Score 1 14    Vision:Within last year and Dental: No current dental problems and Receives regular dental care  Patient Active Problem List   Diagnosis Date Noted   Multiple thyroid nodules 01/17/2023   Current smoker 03/04/2021   Hives 03/02/2021   Hematuria 03/02/2021   Subclinical hyperthyroidism 01/24/2021   Status post cholecystectomy 01/19/2021   Gastroesophageal reflux disease without esophagitis 01/19/2021   Epigastric pain 01/19/2021   Right upper quadrant pain 01/19/2021   Nausea 01/19/2021   Stress 01/19/2021   Primary osteoarthritis of right knee 04/30/2020   Bilateral hearing loss 03/12/2020   Left thyroid nodule 03/12/2020   Tinnitus of both ears 03/09/2020   Depressed mood 06/26/2018   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   Elevated fasting glucose 03/15/2018   Low TSH level 03/15/2018   Early menopause occurring in patient age younger than 14 years  03/12/2018   Class 1 obesity due to excess calories without serious comorbidity with body mass index (BMI) of 34.0 to 34.9 in adult 03/12/2018   Family history of Charcot-Marie-Tooth disease 03/12/2018   Neuropathy 03/12/2018   Depression 02/06/2014   Anxiety 02/06/2014   Encounter for smoking cessation counseling 02/06/2014   Past Medical History:  Diagnosis Date   Allergy    Anxiety    Depression    History of thyroid nodule    Vaginal Pap smear, abnormal    Vitamin D deficiency    Past Surgical History:  Procedure Laterality Date   CHOLECYSTECTOMY     Family History  Problem Relation Age of Onset   Cancer Mother        OVARIAN   Stroke Mother    Heart disease Father 50       AMI/stenting   Hypertension Father    Hyperlipidemia Father    Multiple sclerosis Father    Charcot-Marie-Tooth disease Father    Ovarian cancer Maternal Grandmother    Heart disease Maternal Grandfather    Breast cancer Other    Allergies  Allergen Reactions   Bactrim [Sulfamethoxazole-Trimethoprim] Hives      Patient Care Team: Nolene Ebbs as PCP - General (Family Medicine)   Outpatient Medications Prior to Visit  Medication Sig   Cholecalciferol (VITAMIN D3) 100000 UNIT/GM POWD Take by mouth.  hydrOXYzine (VISTARIL) 25 MG capsule TAKE 1 CAPSULE (25 MG TOTAL) BY MOUTH AT BEDTIME AS NEEDED.   levOCARNitine (CARNITINE, L,) POWD 500 mg by Does not apply route.   methimazole (TAPAZOLE) 5 MG tablet Take 2.5 mg by mouth daily.   VITAMIN D PO Take 3,000 Units by mouth.   No facility-administered medications prior to visit.    Review of Systems  All other systems reviewed and are negative.         Objective:     BP 128/78   Pulse 74   Ht 5\' 6"  (1.676 m)   Wt 187 lb (84.8 kg)   LMP 11/10/2020 (Approximate)   SpO2 99%   BMI 30.18 kg/m  BP Readings from Last 3 Encounters:  01/19/23 128/78  05/03/22 115/74  01/31/22 121/83   Wt Readings from Last 3 Encounters:   01/19/23 187 lb (84.8 kg)  05/03/22 187 lb (84.8 kg)  01/31/22 177 lb 1.3 oz (80.3 kg)      Physical Exam   BP 128/78   Pulse 74   Ht 5\' 6"  (1.676 m)   Wt 187 lb (84.8 kg)   LMP 11/10/2020 (Approximate)   SpO2 99%   BMI 30.18 kg/m   General Appearance:    Alert, cooperative, obese no distress, appears stated age  Head:    Normocephalic, without obvious abnormality, atraumatic  Eyes:    PERRL, conjunctiva/corneas clear, EOM's intact, fundi    benign, slight bulge of both eyes  Ears:    Normal TM's and external ear canals, both ears  Nose:   Nares normal, septum midline, mucosa normal, no drainage    or sinus tenderness  Throat:   Lips, mucosa, and tongue normal; teeth and gums normal  Neck:   Supple, symmetrical, trachea midline, no adenopathy;    thyroid:  no enlargement/tenderness/nodules; no carotid   bruit or JVD  Back:     Symmetric, no curvature, ROM normal, no CVA tenderness  Lungs:     Clear to auscultation bilaterally, respirations unlabored  Chest Wall:    No tenderness or deformity   Heart:    Regular rate and rhythm, S1 and S2 normal, no murmur, rub   or gallop     Abdomen:     Soft, non-tender, bowel sounds active all four quadrants,    no masses, no organomegaly        Extremities:   Extremities normal, atraumatic, no cyanosis or edema  Pulses:   2+ and symmetric all extremities  Skin:   Skin color, texture, turgor normal, no rashes or lesions  Lymph nodes:   Cervical, supraclavicular, and axillary nodes normal         Assessment & Plan:    Routine Health Maintenance and Physical Exam  Immunization History  Administered Date(s) Administered   Influenza, Seasonal, Injecte, Preservative Fre 01/19/2023   Janssen (J&J) SARS-COV-2 Vaccination 07/29/2020   Tdap 01/19/2023    Health Maintenance  Topic Date Due   Colonoscopy  Never done   MAMMOGRAM  07/02/2020   COVID-19 Vaccine (2 - Janssen risk series) 02/02/2023 (Originally 08/26/2020)   Cervical  Cancer Screening (HPV/Pap Cotest)  05/04/2027   DTaP/Tdap/Td (2 - Td or Tdap) 01/18/2033   INFLUENZA VACCINE  Completed   Hepatitis C Screening  Completed   HIV Screening  Completed   HPV VACCINES  Aged Out    Discussed health benefits of physical activity, and encouraged her to engage in regular exercise appropriate for her age and condition.  Marland KitchenMarland Kitchen  Midori was seen today for annual exam.  Diagnoses and all orders for this visit:  Physical exam, routine -     CBC w/Diff/Platelet -     Lipid panel -     CMP14+EGFR -     Flu vaccine trivalent PF, 6mos and older(Flulaval,Afluria,Fluarix,Fluzone) -     VITAMIN D 25 Hydroxy (Vit-D Deficiency, Fractures) -     Hemoglobin A1c  Subclinical hyperthyroidism  Elevated fasting glucose  Multiple thyroid nodules  Need for influenza vaccination -     Flu vaccine trivalent PF, 6mos and older(Flulaval,Afluria,Fluarix,Fluzone)  Medication management -     CBC w/Diff/Platelet -     Lipid panel -     CMP14+EGFR -     Flu vaccine trivalent PF, 6mos and older(Flulaval,Afluria,Fluarix,Fluzone) -     VITAMIN D 25 Hydroxy (Vit-D Deficiency, Fractures) -     Hemoglobin A1c  Screening for lipid disorders -     Lipid panel  Screening for diabetes mellitus -     CMP14+EGFR  Colon cancer screening -     Ambulatory referral to Gastroenterology  Visit for screening mammogram -     MM 3D SCREENING MAMMOGRAM BILATERAL BREAST  Encounter for smoking cessation counseling -     Varenicline Tartrate, Starter, (CHANTIX STARTING MONTH PAK) 0.5 MG X 11 & 1 MG X 42 TBPK; Take one 0.5 mg tablet by mouth once daily for 3 days, then increase to one 0.5 mg tablet twice daily for 4 days, then increase to one 1 mg tablet twice daily. -     varenicline (CHANTIX) 1 MG tablet; Take 1 tablet (1 mg total) by mouth 2 (two) times daily.  CMT (Charcot-Marie-Tooth disease)  Need for Tdap vaccination -     Tdap vaccine greater than or equal to 7yo IM  .Marland Kitchen Discussed 150  minutes of exercise a week.  Encouraged vitamin D 1000 units and Calcium 1300mg  or 4 servings of dairy a day.  PHQ and GAD look really good and patient reports her mood is doing well Fasting labs ordered today Vitals look great Colonoscopy ordered, discussed importance of getting this screening.  Mammogram ordered Pap UTD Declined covid Flu and Tdap shot given today  Discussed smoking cessation Pt agrees to trial of chantix-discussed SE's Discussed coping during her cravings Follow up in 3 months Try to stop smoking in the first 30 days.   Endocrinology manages her hyperthyroidism   Pt is being managed by neurology for CMT. This year she got bilateral leg braces that have helped tremendously with her walking and falls prevention.   Return in about 3 months (around 04/19/2023) for smoking cessation.     Tandy Gaw, PA-C

## 2023-01-23 DIAGNOSIS — Z79899 Other long term (current) drug therapy: Secondary | ICD-10-CM | POA: Diagnosis not present

## 2023-01-23 DIAGNOSIS — Z13220 Encounter for screening for lipoid disorders: Secondary | ICD-10-CM | POA: Diagnosis not present

## 2023-01-23 DIAGNOSIS — Z0000 Encounter for general adult medical examination without abnormal findings: Secondary | ICD-10-CM | POA: Diagnosis not present

## 2023-01-23 DIAGNOSIS — Z131 Encounter for screening for diabetes mellitus: Secondary | ICD-10-CM | POA: Diagnosis not present

## 2023-01-24 ENCOUNTER — Encounter: Payer: Self-pay | Admitting: Physician Assistant

## 2023-01-24 DIAGNOSIS — R7303 Prediabetes: Secondary | ICD-10-CM | POA: Insufficient documentation

## 2023-01-24 LAB — CBC WITH DIFFERENTIAL/PLATELET
Basophils Absolute: 0.1 10*3/uL (ref 0.0–0.2)
Basos: 1 %
EOS (ABSOLUTE): 0.2 10*3/uL (ref 0.0–0.4)
Eos: 2 %
Hematocrit: 41.9 % (ref 34.0–46.6)
Hemoglobin: 14.3 g/dL (ref 11.1–15.9)
Immature Grans (Abs): 0 10*3/uL (ref 0.0–0.1)
Immature Granulocytes: 0 %
Lymphocytes Absolute: 2 10*3/uL (ref 0.7–3.1)
Lymphs: 26 %
MCH: 31.6 pg (ref 26.6–33.0)
MCHC: 34.1 g/dL (ref 31.5–35.7)
MCV: 93 fL (ref 79–97)
Monocytes Absolute: 0.6 10*3/uL (ref 0.1–0.9)
Monocytes: 8 %
Neutrophils Absolute: 4.8 10*3/uL (ref 1.4–7.0)
Neutrophils: 63 %
Platelets: 265 10*3/uL (ref 150–450)
RBC: 4.52 x10E6/uL (ref 3.77–5.28)
RDW: 12.2 % (ref 11.7–15.4)
WBC: 7.7 10*3/uL (ref 3.4–10.8)

## 2023-01-24 LAB — LIPID PANEL
Chol/HDL Ratio: 2.9 ratio (ref 0.0–4.4)
Cholesterol, Total: 154 mg/dL (ref 100–199)
HDL: 53 mg/dL (ref >39)
LDL Chol Calc (NIH): 84 mg/dL (ref 0–99)
Triglycerides: 90 mg/dL (ref 0–149)
VLDL Cholesterol Cal: 17 mg/dL (ref 5–40)

## 2023-01-24 LAB — CMP14+EGFR
ALT: 10 [IU]/L (ref 0–32)
AST: 13 [IU]/L (ref 0–40)
Albumin: 4.1 g/dL (ref 3.9–4.9)
Alkaline Phosphatase: 94 [IU]/L (ref 44–121)
BUN/Creatinine Ratio: 29 — ABNORMAL HIGH (ref 9–23)
BUN: 18 mg/dL (ref 6–24)
Bilirubin Total: <0.2 mg/dL (ref 0.0–1.2)
CO2: 23 mmol/L (ref 20–29)
Calcium: 9.2 mg/dL (ref 8.7–10.2)
Chloride: 104 mmol/L (ref 96–106)
Creatinine, Ser: 0.62 mg/dL (ref 0.57–1.00)
Globulin, Total: 2.3 g/dL (ref 1.5–4.5)
Glucose: 95 mg/dL (ref 70–99)
Potassium: 4.6 mmol/L (ref 3.5–5.2)
Sodium: 140 mmol/L (ref 134–144)
Total Protein: 6.4 g/dL (ref 6.0–8.5)
eGFR: 109 mL/min/{1.73_m2} (ref >59)

## 2023-01-24 LAB — VITAMIN D 25 HYDROXY (VIT D DEFICIENCY, FRACTURES): Vit D, 25-Hydroxy: 45.6 ng/mL (ref 30.0–100.0)

## 2023-01-24 LAB — HEMOGLOBIN A1C
Est. average glucose Bld gHb Est-mCnc: 128 mg/dL
Hgb A1c MFr Bld: 6.1 % — ABNORMAL HIGH (ref 4.8–5.6)

## 2023-01-24 NOTE — Progress Notes (Signed)
Lauren Bowers,   Your A1C has gone up quite a bit and you are now in pre-diabetes. Need to start limiting sugars and carbs in diet. Recheck in 6 months.   Vitamin D looks great.   Cholesterol looks great.  10 year CV risk low.   Marland Kitchen.The 10-year ASCVD risk score (Arnett DK, et al., 2019) is: 2.4%   Values used to calculate the score:     Age: 47 years     Sex: Female     Is Non-Hispanic African American: No     Diabetic: No     Tobacco smoker: Yes     Systolic Blood Pressure: 128 mmHg     Is BP treated: No     HDL Cholesterol: 53 mg/dL     Total Cholesterol: 154 mg/dL

## 2023-02-06 ENCOUNTER — Other Ambulatory Visit: Payer: Self-pay

## 2023-02-06 DIAGNOSIS — R928 Other abnormal and inconclusive findings on diagnostic imaging of breast: Secondary | ICD-10-CM

## 2023-02-06 NOTE — Progress Notes (Signed)
Ordered diagnostic mammogram and ultrasound of the right breast. Patient is aware. Advised her to call back if the [facility] doesn't call in the next couple of days.

## 2023-02-28 ENCOUNTER — Ambulatory Visit
Admission: RE | Admit: 2023-02-28 | Discharge: 2023-02-28 | Disposition: A | Discharge status: Home / Self Care | Payer: BC Managed Care – PPO | Source: Ambulatory Visit | Attending: Physician Assistant | Admitting: Physician Assistant

## 2023-02-28 DIAGNOSIS — R928 Other abnormal and inconclusive findings on diagnostic imaging of breast: Secondary | ICD-10-CM

## 2023-02-28 DIAGNOSIS — L309 Dermatitis, unspecified: Secondary | ICD-10-CM | POA: Diagnosis not present

## 2023-02-28 DIAGNOSIS — N6311 Unspecified lump in the right breast, upper outer quadrant: Secondary | ICD-10-CM | POA: Diagnosis not present

## 2023-02-28 DIAGNOSIS — N6315 Unspecified lump in the right breast, overlapping quadrants: Secondary | ICD-10-CM | POA: Diagnosis not present

## 2023-03-01 DIAGNOSIS — E0590 Thyrotoxicosis, unspecified without thyrotoxic crisis or storm: Secondary | ICD-10-CM | POA: Diagnosis not present

## 2023-05-01 DIAGNOSIS — D229 Melanocytic nevi, unspecified: Secondary | ICD-10-CM | POA: Diagnosis not present

## 2023-05-01 DIAGNOSIS — L308 Other specified dermatitis: Secondary | ICD-10-CM | POA: Diagnosis not present

## 2023-05-01 DIAGNOSIS — L821 Other seborrheic keratosis: Secondary | ICD-10-CM | POA: Diagnosis not present

## 2023-05-01 DIAGNOSIS — L814 Other melanin hyperpigmentation: Secondary | ICD-10-CM | POA: Diagnosis not present

## 2023-06-16 DIAGNOSIS — N390 Urinary tract infection, site not specified: Secondary | ICD-10-CM | POA: Diagnosis not present

## 2023-06-20 NOTE — Progress Notes (Unsigned)
 GYNECOLOGY OFFICE VISIT NOTE  History:   Lauren Bowers is a 48 y.o. [phone] here today for hyst consult and recurrent UTIs.   FMH: Mother with ovarian cancer. MGM with ovarian cancer. Maternal great aunt had breast cancer. Now with cousin diagnosed with breast cancer at age 20. She is unsure of cousins genetics.   Discussed the use of AI scribe software for clinical note transcription with the patient, who gave verbal consent to proceed.  History of Present Illness   Lauren Bowers is a 48 year old female who presents with recurrent urinary tract infections and consideration for hysterectomy due to family history of cancer.  She experiences recurrent urinary tract infections (UTIs) following sexual intercourse. She has had three partners since her separation four years ago, and each time she has developed a UTI. The UTIs are described as painful, and she is interested in preventive medication to avoid future occurrences.  She is considering a hysterectomy, influenced by her family history of cancer. Her grandmother had ovarian cancer, her mother showed signs of it, a great aunt is a breast cancer survivor, and a cousin recently underwent a double mastectomy due to breast cancer. She has not undergone genetic testing for cancer risk but has had genetic testing for Charcot-Marie-Tooth disease.  She has been postmenopausal since 2019 and experiences hot flashes. She was previously on methimazole for hyperthyroidism to help with hot flashes but discontinued it due to a rash. She continues to experience skin and hair changes. She has a history of two stillbirths, one of which involved significant bleeding. No current use of hormone replacement therapy.         Past Medical History:  Diagnosis Date   Allergy    Anxiety    Depression    History of thyroid nodule    Vaginal Pap smear, abnormal    Vitamin D deficiency     Past Surgical History:  Procedure Laterality Date   CHOLECYSTECTOMY       The following portions of the patient's history were reviewed and updated as appropriate: allergies, current medications, past family history, past medical history, past social history, past surgical history and problem list.   Health Maintenance:    Diagnosis  Date Value Ref Range Status  05/03/2022   Final   - Negative for intraepithelial lesion or malignancy (NILM)   HPV Negative  Normal mammogram on 02/28/23, birad 2, cat b density.   Review of Systems:  Pertinent items noted in HPI and remainder of comprehensive ROS otherwise negative.  Physical Exam:  BP 125/74   Pulse 75   Ht 5\' 6"  (1.676 m)   Wt 195 lb (88.5 kg)   LMP 11/10/2020 (Approximate)   BMI 31.47 kg/m  CONSTITUTIONAL: Well-developed, well-nourished female in no acute distress.  HEENT:  Normocephalic, atraumatic. External right and left ear normal. No scleral icterus.  NECK: Normal range of motion, supple, no masses noted on observation SKIN: No rash noted. Not diaphoretic. No erythema. No pallor. MUSCULOSKELETAL: Normal range of motion. No edema noted. NEUROLOGIC: Alert and oriented to person, place, and time. Normal muscle tone coordination. No cranial nerve deficit noted. PSYCHIATRIC: Normal mood and affect. Normal behavior. Normal judgment and thought content.  PELVIC: Deferred  Labs and Imaging No results found for this or any previous visit (from the past week). No results found.  Assessment and Plan:   1. Recurrent UTI (Primary) Prescribe Macrobid pre- or post-intercourse to prevent UTIs.  2. Family history of ovarian  cancer Strong family history of related cancers. Discussed genetic testing benefits for risk assessment and management. - Discuss genetic testing for breast, ovarian, and uterine cancer genes. - Emailed [employer] about billing and patient responsibility if deductible not met. - Encourage gathering family genetic testing information. She will contact her cousin re: genetic testing.  -  Considering prophylactic surgery due to cancer history. Discussed laparoscopic bilateral salpingo-oophorectomy and total laparoscopic hysterectomy options, recovery, risks, and benefits. Genetic testing may influence recommendations. - Discuss surgical options: laparoscopic bilateral salpingo-oophorectomy and total laparoscopic hysterectomy with bilateral salpingo-oophorectomy. Reviewed with hyst, we also do cystoscopy and TAP block.  - Explain recovery and risks: Bleeding - Can bleed enough to need transfusion or need for additional surgeries I.e. conversation to open surgery.  Infection - The vagina can develop cuff infection Injury to surrounding organs/tissues (i.e. bowel/bladder/ureters) - discussed how each of these is managed I.e. bladder injury requires catheter for 10-14 days after surgery Need for additional procedures - Would be specific to a complication or injury - Reviewed typical hospital stay (same day surgery) - Advise on surgery timing related to insurance deductible. - Encourage gathering family genetic testing information.   Meds ordered this encounter  Medications   nitrofurantoin, macrocrystal-monohydrate, (MACROBID) 100 MG capsule    Sig: Take 1 capsule (100 mg total) by mouth as directed. Take after intercourse    Dispense:  30 capsule    Refill:  1     Routine preventative health maintenance measures emphasized. Please refer to After Visit Summary for other counseling recommendations.   No follow-ups on file.  Milas Hock, MD, FACOG Obstetrician & Gynecologist, [facility] for [facility], [facility]

## 2023-06-21 ENCOUNTER — Ambulatory Visit (INDEPENDENT_AMBULATORY_CARE_PROVIDER_SITE_OTHER): Admitting: Obstetrics and Gynecology

## 2023-06-21 ENCOUNTER — Encounter: Payer: Self-pay | Admitting: Obstetrics and Gynecology

## 2023-06-21 VITALS — BP 125/74 | HR 75 | Ht 66.0 in | Wt 195.0 lb

## 2023-06-21 DIAGNOSIS — Z8041 Family history of malignant neoplasm of ovary: Secondary | ICD-10-CM

## 2023-06-21 DIAGNOSIS — N390 Urinary tract infection, site not specified: Secondary | ICD-10-CM

## 2023-06-21 MED ORDER — NITROFURANTOIN MONOHYD MACRO 100 MG PO CAPS: 100.0000 mg | ORAL_CAPSULE | ORAL | 1 refills | Status: DC

## 2023-06-21 NOTE — Progress Notes (Signed)
 Pt is currently being treated for UTI

## 2023-06-25 ENCOUNTER — Encounter: Payer: Self-pay | Admitting: Obstetrics and Gynecology

## 2023-07-31 ENCOUNTER — Ambulatory Visit (INDEPENDENT_AMBULATORY_CARE_PROVIDER_SITE_OTHER): Payer: BC Managed Care – PPO | Admitting: Physician Assistant

## 2023-07-31 ENCOUNTER — Encounter: Payer: Self-pay | Admitting: Physician Assistant

## 2023-07-31 VITALS — BP 115/74 | HR 74 | Ht 66.0 in | Wt 197.0 lb

## 2023-07-31 DIAGNOSIS — Z716 Tobacco abuse counseling: Secondary | ICD-10-CM

## 2023-07-31 DIAGNOSIS — Z8041 Family history of malignant neoplasm of ovary: Secondary | ICD-10-CM | POA: Diagnosis not present

## 2023-07-31 DIAGNOSIS — E042 Nontoxic multinodular goiter: Secondary | ICD-10-CM

## 2023-07-31 DIAGNOSIS — Z803 Family history of malignant neoplasm of breast: Secondary | ICD-10-CM | POA: Diagnosis not present

## 2023-07-31 DIAGNOSIS — Z1331 Encounter for screening for depression: Secondary | ICD-10-CM | POA: Diagnosis not present

## 2023-07-31 DIAGNOSIS — Z1211 Encounter for screening for malignant neoplasm of colon: Secondary | ICD-10-CM | POA: Diagnosis not present

## 2023-07-31 DIAGNOSIS — R7303 Prediabetes: Secondary | ICD-10-CM

## 2023-07-31 DIAGNOSIS — G600 Hereditary motor and sensory neuropathy: Secondary | ICD-10-CM | POA: Diagnosis not present

## 2023-07-31 LAB — POCT GLYCOSYLATED HEMOGLOBIN (HGB A1C): Hemoglobin A1C: 5.4 % (ref 4.0–5.6)

## 2023-07-31 MED ORDER — VARENICLINE TARTRATE 1 MG PO TABS: 1.0000 mg | ORAL_TABLET | Freq: Two times a day (BID) | ORAL | 5 refills | Status: DC

## 2023-07-31 NOTE — Patient Instructions (Addendum)
 Continue Chantix  to stop smoking.  Referral for colonoscopy ordered.

## 2023-07-31 NOTE — Progress Notes (Unsigned)
 Established Patient Office Visit  Subjective   Patient ID: Lauren Bowers, female    DOB: June 15, 1975  Age: 48 y.o. 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   CMT-managed by neurology and just had appt. Getting fitting for more braces. Only one fall due to drop foot. No injury with fall.   She has made diet changes and staying as active as she can. Not checking sugars.   She has cut down a lot with smoking cessation. She is down to 4-5 cigs a day. She wonders if we can increase chantix .   She has GYN and considering hysterectomy but want her to do genetic testing first due to mothers ovarian cancer. She wonders if she can do here.    ROS See HPI.    Objective:     BP 115/74   Pulse 74   Ht 5\' 6"  (1.676 m)   Wt 197 lb (89.4 kg)   LMP 11/10/2020 (Approximate)   SpO2 100%   BMI 31.80 kg/m  BP Readings from Last 3 Encounters:  07/31/23 115/74  06/21/23 125/74  01/19/23 128/78   Wt Readings from Last 3 Encounters:  07/31/23 197 lb (89.4 kg)  06/21/23 195 lb (88.5 kg)  01/19/23 187 lb (84.8 kg)      Physical Exam Constitutional:      Appearance: Normal appearance. She is obese.  HENT:     Head: Normocephalic.  Cardiovascular:     Rate and Rhythm: Normal rate and regular rhythm.  Pulmonary:     Effort: Pulmonary effort is normal.     Breath sounds: Normal breath sounds.  Musculoskeletal:     Comments: Drop foot, left worse than right.   Neurological:     General: No focal deficit present.     Mental Status: She is alert and oriented to person, place, and time.  Psychiatric:        Mood and Affect: Mood normal.      Results for orders placed or performed in visit on 07/31/23  POCT HgB A1C   Result Value Ref Range   Hemoglobin A1C 5.4 4.0 - 5.6 %   HbA1c POC (<> result, manual entry)     HbA1c, POC (prediabetic range)     HbA1c, POC (controlled diabetic range)      The 10-year ASCVD risk score (Arnett DK, et al., 2019) is: 2%    Assessment & Plan:  Aaron AasAaron AasDeidra was seen today for medical management of chronic issues.  Diagnoses and all orders for this visit:  Pre-diabetes -     POCT HgB A1C  Multiple thyroid  nodules  Family history of ovarian cancer  Encounter for smoking cessation counseling -     varenicline  (CHANTIX ) 1 MG tablet; Take 1 tablet (1 mg total) by mouth 2 (two) times daily.  Screening for colon cancer -     Ambulatory referral to Gastroenterology   A1C improved with diet and exercise and in normal range now. Way to go!  Keep up the good work.  Vitals look GREAT!  Continue to work on smoking cessation Stay on chantix  Discussed using nicotine gum as needed instead of cigarettes  Another  referral made for colonoscopy for screening for colon cancer Let me know if not called  Pt would like genetic testing done due to her family hx of mother with ovarian cancer. She would like to have to weigh in on GYN decision of hysterectomy or not and to leave ovaries or not.  Myraid kit sent in today.   CMT- managed by neurology. Doing ok. One fall. She is looking into getting a better brace for feet.    Stachia Slutsky, PA-C

## 2023-08-01 ENCOUNTER — Encounter: Payer: Self-pay | Admitting: Physician Assistant

## 2023-08-22 ENCOUNTER — Ambulatory Visit: Payer: Self-pay

## 2023-08-22 NOTE — Telephone Encounter (Signed)
 Copied from CRM [id number]. Topic: Clinical - Red Word Triage >> Aug 22, 2023 11:33 AM Lauren Bowers F wrote: Red Word that prompted transfer to Nurse Triage:   Very congested for the past four days; all sinus (no accompanying symptoms); Discolored yellow phelgm  What has the patient taken to help with symptoms:   Mucinex  Cold and Cough  Tea & Honey  Soups   Looking to schedule a virtual visit with PCP  Chief Complaint: Sinus congestion Symptoms: Low grade fever and earache on day 1 Frequency: 4-5 days Pertinent Negatives: Patient denies difficulty breathing Disposition: [] ED /[] Urgent Care (no appt availability in office) / [x] Appointment(In office/virtual)/ []  [facility]/ [] Home Care/ [] Refused Recommended Disposition /[] [facility]/ []  Follow-up with PCP Additional Notes: Patient called in to report sinus congestion that has been present for 4-5 days. Patient stated she is not experiencing any sinus pain/pressure. Patient experienced a low grade fever and earache on day 1. Patient denied sore throat, cough and difficulty breathing. Patient is seeking clinical help. Advised patient to be seen within 24 hours, per protocol. No availability with PCP. Scheduled with alternate provider in office. Provided care advice and instructed patient to call back if symptoms worsen. Patient complied.   Reason for Disposition  Earache  Answer Assessment - Initial Assessment Questions 1. LOCATION: "Where does it hurt?"      Denies sinus pain and pressure  4. RECURRENT SYMPTOM: "Have you ever had sinus problems before?" If Yes, ask: "When was the last time?" and "What happened that time?"      Denies 5. NASAL CONGESTION: "Is the nose blocked?" If Yes, ask: "Can you open it or must you breathe through your mouth?"     Both sides are congested, but she is able to breathe through her mouth 6. NASAL DISCHARGE: "Do you have discharge from your nose?" If so ask, "What color?"      Sometimes clear, sometimes not clear 7. FEVER: "Do you have a fever?" If Yes, ask: "What is it, how was it measured, and when did it start?"      Slight fever on Sunday, fever is gone 8. OTHER SYMPTOMS: "Do you have any other symptoms?" (e.g., sore throat, cough, earache, difficulty breathing)     Taking Mucinex and cold & flu daytime/night time, no relief Denies cough, denies sore throat, denies respiratory issues Earache on first day, not currently at this time  Protocols used: Sinus Pain or Congestion-A-AH

## 2023-08-23 ENCOUNTER — Ambulatory Visit (INDEPENDENT_AMBULATORY_CARE_PROVIDER_SITE_OTHER): Admitting: Family Medicine

## 2023-08-23 ENCOUNTER — Encounter: Payer: Self-pay | Admitting: Family Medicine

## 2023-08-23 VITALS — BP 112/76 | HR 72 | Temp 98.3°F | Resp 18 | Ht 66.0 in | Wt 192.2 lb

## 2023-08-23 DIAGNOSIS — J329 Chronic sinusitis, unspecified: Secondary | ICD-10-CM | POA: Insufficient documentation

## 2023-08-23 DIAGNOSIS — J019 Acute sinusitis, unspecified: Secondary | ICD-10-CM

## 2023-08-23 MED ORDER — DOXYCYCLINE HYCLATE 100 MG PO TABS: 100.0000 mg | ORAL_TABLET | Freq: Two times a day (BID) | ORAL | 0 refills | Status: DC

## 2023-08-23 NOTE — Patient Instructions (Signed)

## 2023-08-23 NOTE — Progress Notes (Signed)
 Lauren Bowers - 48 y.o. female MRN [id number]  Date of birth: 1975-07-22  Subjective Chief Complaint  Patient presents with   Nasal Congestion    Pt states she has fever, nasal congestion, and minor cough since sun    HPI Lauren Bowers is a 48 y.o. female here today with complaint of fever, nasal congestion, ear pressure and pain, continued nasal congestion.  Was starting to feel better but has developed more nasal congestion and mucus production  Mucus is now thick and yellow. Denies increased sinus pain.  Fever and ear pain have resolved.   ROS:  A comprehensive ROS was completed and negative except as noted per HPI  Allergies  Allergen Reactions   Bactrim [Sulfamethoxazole-Trimethoprim] Hives   Methimazole Rash    Past Medical History:  Diagnosis Date   Allergy    Anxiety    Depression    History of thyroid  nodule    Vaginal Pap smear, abnormal    Vitamin D  deficiency     Past Surgical History:  Procedure Laterality Date   CHOLECYSTECTOMY      Social History   Socioeconomic History   Marital status: Legally Separated    Spouse name: Not on file   Number of children: 1   Years of education: Not on file   Highest education level: Not on file  Occupational History   Occupation: cUSTOMER SERVICE [employer]  Tobacco Use   Smoking status: Every Day    Current packs/day: 0.50    Average packs/day: 0.5 packs/day for 30.8 years (15.4 ttl pk-yrs)    Types: Cigarettes    Start date: 10/23/1992   Smokeless tobacco: Never  Vaping Use   Vaping status: Never Used  Substance and Sexual Activity   Alcohol use: Yes   Drug use: No   Sexual activity: Yes    Birth control/protection: Pill  Other Topics Concern   Not on file  Social History Narrative   Marital status: married      Child: one      Lives: with husband, child.      Employment:  Call center for [employer].         Social Drivers of Corporate investment banker Strain: Low Risk  (07/31/2023)   Received from  [facility]   Overall Financial Resource Strain (CARDIA)    Difficulty of Paying Living Expenses: Not hard at all  Food Insecurity: No Food Insecurity (07/31/2023)   Received from [facility]   Hunger Vital Sign    Worried About Running Out of Food in the Last Year: Never true    Ran Out of Food in the Last Year: Never true  Transportation Needs: No Transportation Needs (07/31/2023)   Received from [facility] - Transportation    Lack of Transportation (Medical): No    Lack of Transportation (Non-Medical): No  Physical Activity: Not on file  Stress: Not on file  Social Connections: Unknown (07/28/2021)   Received from [facility], [facility]   Social Network    Social Network: Not on file    Family History  Problem Relation Age of Onset   Cancer Mother        OVARIAN   Stroke Mother    Heart disease Father 26       AMI/stenting   Hypertension Father    Hyperlipidemia Father    Multiple sclerosis Father    Charcot-Marie-Tooth disease Father    Ovarian cancer Maternal Grandmother  Heart disease Maternal Grandfather    Breast cancer Other     Health Maintenance  Topic Date Due   Pneumococcal Vaccine 56-64 Years old (1 of 2 - PCV) Never done   COVID-19 Vaccine (2 - Janssen risk series) 08/26/2020   Colonoscopy  07/31/2024 (Originally 10/24/2018)   INFLUENZA VACCINE  10/26/2023   MAMMOGRAM  02/28/2024   Cervical Cancer Screening (HPV/Pap Cotest)  05/04/2027   DTaP/Tdap/Td (2 - Td or Tdap) 01/18/2033   Hepatitis C Screening  Completed   HIV Screening  Completed   HPV VACCINES  Aged Out   Meningococcal B Vaccine  Aged Out     ----------------------------------------------------------------------------------------------------------------------------------------------------------------------------------------------------------------- Physical Exam BP 112/76 (BP Location: Left Arm, Patient Position: Sitting, Cuff Size: Large)   Pulse 72   Temp 98.3 F  (36.8 C) (Oral)   Resp 18   Ht 5\' 6"  (1.676 m)   Wt 192 lb 4 oz (87.2 kg)   LMP 11/10/2020 (Approximate)   SpO2 99%   BMI 31.03 kg/m   Physical Exam Constitutional:      Appearance: Normal appearance.  HENT:     Head: Normocephalic and atraumatic.     Right Ear: Tympanic membrane normal.     Left Ear: Tympanic membrane normal.  Eyes:     General: No scleral icterus. Cardiovascular:     Rate and Rhythm: Normal rate and regular rhythm.  Pulmonary:     Effort: Pulmonary effort is normal.     Breath sounds: Normal breath sounds.  Neurological:     Mental Status: She is alert.  Psychiatric:        Mood and Affect: Mood normal.        Behavior: Behavior normal.     ------------------------------------------------------------------------------------------------------------------------------------------------------------------------------------------------------------------- Assessment and Plan  Sinusitis Likely viral in nature. Recommend continued supportive care with increased fluids and OTC medications.  I did send in doxycyline if she notices that symptoms seem to be worsening or not improving after 7-10 days.    Meds ordered this encounter  Medications   doxycycline (VIBRA-TABS) 100 MG tablet    Sig: Take 1 tablet (100 mg total) by mouth 2 (two) times daily.    Dispense:  20 tablet    Refill:  0    No follow-ups on file.

## 2023-08-23 NOTE — Assessment & Plan Note (Signed)
 Likely viral in nature. Recommend continued supportive care with increased fluids and OTC medications.  I did send in doxycyline if she notices that symptoms seem to be worsening or not improving after 7-10 days.

## 2023-08-30 ENCOUNTER — Telehealth: Payer: Self-pay

## 2023-08-30 DIAGNOSIS — E042 Nontoxic multinodular goiter: Secondary | ICD-10-CM | POA: Diagnosis not present

## 2023-08-30 DIAGNOSIS — E0590 Thyrotoxicosis, unspecified without thyrotoxic crisis or storm: Secondary | ICD-10-CM | POA: Diagnosis not present

## 2023-08-30 DIAGNOSIS — N951 Menopausal and female climacteric states: Secondary | ICD-10-CM | POA: Diagnosis not present

## 2023-08-30 NOTE — Telephone Encounter (Signed)
 Copied from CRM [id number]. Topic: Clinical - Lab/Test Results >> Aug 30, 2023  9:50 AM Lauren Bowers C wrote: Reason for CRM: Patient would like a call back at [phone], she states she took some genetics testing awhile back and has not gotten any results.

## 2023-08-30 NOTE — Telephone Encounter (Signed)
 Lauren Bowers, is this something you can assist with?

## 2023-09-03 ENCOUNTER — Telehealth: Payer: Self-pay

## 2023-09-03 NOTE — Telephone Encounter (Signed)
 Lauren Schillings,  Do you know anything about this ? Shows was done on 07/31/2023.

## 2023-09-03 NOTE — Telephone Encounter (Signed)
 Copied from CRM [id number]. Topic: Clinical - Lab/Test Results >> Sep 03, 2023 10:35 AM Lauren Bowers wrote: Reason for CRM: Patient would like a call back at [phone], she states she took some genetics testing awhile back and has not gotten any results, patient would like those results is available.

## 2023-09-18 ENCOUNTER — Telehealth: Payer: Self-pay

## 2023-09-18 NOTE — Telephone Encounter (Signed)
 RN returned patient call. Pt reported would like to discuss recent genetic results with Dr. Cleatus, scheduled patient for annual and follow up.  Silvano LELON Piano, RN

## 2023-10-11 ENCOUNTER — Encounter: Payer: Self-pay | Admitting: Obstetrics and Gynecology

## 2023-10-11 ENCOUNTER — Ambulatory Visit: Admitting: Obstetrics and Gynecology

## 2023-10-11 VITALS — BP 124/82 | HR 65 | Ht 65.0 in | Wt 197.0 lb

## 2023-10-11 DIAGNOSIS — Z8041 Family history of malignant neoplasm of ovary: Secondary | ICD-10-CM

## 2023-10-11 DIAGNOSIS — N390 Urinary tract infection, site not specified: Secondary | ICD-10-CM

## 2023-10-11 DIAGNOSIS — Z01419 Encounter for gynecological examination (general) (routine) without abnormal findings: Secondary | ICD-10-CM | POA: Diagnosis not present

## 2023-10-11 NOTE — Progress Notes (Signed)
 ANNUAL EXAM Patient name: Lauren Bowers MRN [id number]  Date of birth: Jul 26, 1975 Chief Complaint:   Gynecologic Exam (Annual and F/U after genetic testing)  History of Present Illness:   Lauren Bowers is a 48 y.o. [phone] female being seen today for a routine annual exam.   Current concerns: She wanted to also discuss plans for parts given recent genetic testing which was negative for BRCA and expanded GYN panel genes (40 genes total).   She reports hot flashes and brain fog. Hot flashes have eased compared to how they were but still present.    Patient's last menstrual period was 11/10/2020 (approximate).   Last MXR: 02/2023  Last Pap/Pap History:  04/2022:  pap/hpv wnl  Review of Systems:   Pertinent items are noted in HPI Denies any headaches, blurred vision, fatigue, shortness of breath, chest pain, abdominal pain, abnormal vaginal discharge/itching/odor/irritation, problems with periods, bowel movements, urination, or intercourse unless otherwise stated above.  Pertinent History Reviewed:  Reviewed past medical,surgical, social and family history.  Reviewed problem list, medications and allergies. Physical Assessment:   Vitals:   10/11/23 1432  BP: 124/82  Pulse: 65  Weight: 197 lb (89.4 kg)  Height: 5' 5 (1.651 m)  Body mass index is 32.78 kg/m.   Physical Examination:  General appearance - well appearing, and in no distress Mental status - alert, oriented to person, place, and time Psych:  She has a normal mood and affect Skin - warm and dry, normal color, no suspicious lesions noted Chest - effort normal Heart - normal rate  Breasts - breasts appear normal, no suspicious masses, no skin or nipple changes or axillary nodes Abdomen - soft, nontender, nondistended, no masses or organomegaly Pelvic -  not indicated VULVA: Not examined VAGINA: Not examined CERVIX: Not examined UTERUS: Not examined ADNEXA: Not examined Extremities:  No swelling or  varicosities noted  Chaperone present for exam  No results found for this or any previous visit (from the past 24 hours).  Assessment & Plan:  Kimiah was seen today for gynecologic exam.  Diagnoses and all orders for this visit:  Encounter for annual routine gynecological examination - Cervical cancer screening: Discussed guidelines. Pap with HPV normal on 04/2022 - STD Testing: not indicated - Discussed PMB and if any bleeding to call.  - Discussed importance of weight bearing exercise.  - Breast Health: Encouraged self breast awareness/SBE. Teaching provided. Discussed limits of clinical breast exam for detecting breast cancer.  - F/U 12 months and prn  Family history of ovarian cancer We discussed options for surveillance vs surgery given that while genetics were normal, she is still potentially at elevated risk of ovarian cancer.  She and I previously discussed LS BSO vs TLH/BSO/cysto/TAP. Since genetics are normal, I would not recommend full hysterectomy, but I do think LS BSO would be wise given that surveillance has never been proven beneficial. She would like to proceed as well.  Discussed typical postop recovery. Reviewed limits following surgery.  -     Ambulatory Referral For Surgery Scheduling  Recurrent UTI  Hot flashes.  Discussed HRT and non-HRT medications in setting of her family history of breast cancer as well.  At this time, she finds symptoms manageable and would prefer lifestyle changes.  Reviewed some supplements that may or may not help. I.eSABRA jaffe   Orders Placed This Encounter  Procedures   Ambulatory Referral For Surgery Scheduling    Meds: No orders of the defined types were placed  in this encounter.   Follow-up: No follow-ups on file.  Vina Solian, MD 10/11/2023 4:44 PM

## 2023-10-29 DIAGNOSIS — G600 Hereditary motor and sensory neuropathy: Secondary | ICD-10-CM | POA: Diagnosis not present

## 2023-10-29 DIAGNOSIS — R2689 Other abnormalities of gait and mobility: Secondary | ICD-10-CM | POA: Diagnosis not present

## 2023-11-14 ENCOUNTER — Encounter: Payer: Self-pay | Admitting: Family Medicine

## 2023-11-14 ENCOUNTER — Ambulatory Visit (INDEPENDENT_AMBULATORY_CARE_PROVIDER_SITE_OTHER): Admitting: Family Medicine

## 2023-11-14 VITALS — BP 122/80 | HR 76 | Ht 65.0 in | Wt 196.0 lb

## 2023-11-14 DIAGNOSIS — J209 Acute bronchitis, unspecified: Secondary | ICD-10-CM | POA: Insufficient documentation

## 2023-11-14 DIAGNOSIS — R051 Acute cough: Secondary | ICD-10-CM

## 2023-11-14 LAB — POC COVID19/FLU A&B COMBO
Covid Antigen, POC: NEGATIVE
Influenza A Antigen, POC: NEGATIVE
Influenza B Antigen, POC: NEGATIVE

## 2023-11-14 MED ORDER — HYDROCODONE BIT-HOMATROP MBR 5-1.5 MG/5ML PO SOLN: 5.0000 mL | Freq: Four times a day (QID) | ORAL | 0 refills | Status: DC | PRN

## 2023-11-14 MED ORDER — PREDNISONE 20 MG PO TABS: 20.0000 mg | ORAL_TABLET | Freq: Two times a day (BID) | ORAL | 0 refills | Status: AC

## 2023-11-14 MED ORDER — ALBUTEROL SULFATE HFA 108 (90 BASE) MCG/ACT IN AERS: 2.0000 | INHALATION_SPRAY | Freq: Four times a day (QID) | RESPIRATORY_TRACT | 0 refills | Status: AC | PRN

## 2023-11-14 NOTE — Progress Notes (Signed)
 Lauren Bowers - 49 y.o. female MRN [id number]  Date of birth: 1975-12-30  Subjective Chief Complaint  Patient presents with   Cough   Covid Exposure    HPI Lauren Bowers is a 48 y.o. female here today with complaint of cough and chest congestion.    Symptoms started 2 days ago.  She denies sinus congestion, fever, chills, nausea or vomiting, body aches or headache..  She has tried EmergenC and OTC cold and fluid medication so far.  She is drinking plenty of fluids.  She is a smoker.  No previous dx of COPD.  She has other friends with similar symptoms.    ROS:  A comprehensive ROS was completed and negative except as noted per HPI  Allergies  Allergen Reactions   Bactrim [Sulfamethoxazole-Trimethoprim] Hives   Methimazole Rash    Past Medical History:  Diagnosis Date   Allergy    Anxiety    Depression    History of thyroid  nodule    Vaginal Pap smear, abnormal    Vitamin D  deficiency     Past Surgical History:  Procedure Laterality Date   CHOLECYSTECTOMY      Social History   Socioeconomic History   Marital status: Legally Separated    Spouse name: Not on file   Number of children: 1   Years of education: Not on file   Highest education level: Not on file  Occupational History   Occupation: cUSTOMER SERVICE [employer]  Tobacco Use   Smoking status: Every Day    Current packs/day: 0.50    Average packs/day: 0.5 packs/day for 31.1 years (15.5 ttl pk-yrs)    Types: Cigarettes    Start date: 10/23/1992   Smokeless tobacco: Never  Vaping Use   Vaping status: Never Used  Substance and Sexual Activity   Alcohol use: Yes    Comment: Socially   Drug use: No   Sexual activity: Yes  Other Topics Concern   Not on file  Social History Narrative   Marital status: married      Child: one      Lives: with husband, child.      Employment:  Call center for [employer].         Social Drivers of Corporate investment banker Strain: Low Risk  (07/31/2023)   Received from  [facility]   Overall Financial Resource Strain (CARDIA)    Difficulty of Paying Living Expenses: Not hard at all  Food Insecurity: No Food Insecurity (07/31/2023)   [facility]   Hunger Vital Sign    Within the past 12 months, you worried that your food would run out before you got the money to buy more.: Never true    Within the past 12 months, the food you bought just didn't last and you didn't have money to get more.: Never true  Transportation Needs: No Transportation Needs (07/31/2023)   Received from [facility]    Lack of Transportation (Medical): No    Lack of Transportation (Non-Medical): No  Physical Activity: Not on file  Stress: Not on file  Social Connections: Unknown (07/28/2021)   Received from [facility]   Social Network    Social Network: Not on file    Family History  Problem Relation Age of Onset   Cancer Mother        OVARIAN   Stroke Mother    Heart disease Father 42  AMI/stenting   Hypertension Father    Hyperlipidemia Father    Multiple sclerosis Father    Charcot-Marie-Tooth disease Father    Ovarian cancer Maternal Grandmother    Heart disease Maternal Grandfather    Breast cancer Other     Health Maintenance  Topic Date Due   Pneumococcal Vaccine: 50+ Years (1 of 2 - PCV) Never done   Hepatitis B Vaccines 19-59 Average Risk (1 of 3 - 19+ 3-dose series) Never done   Zoster Vaccines- Shingrix (1 of 2) Never done   COVID-19 Vaccine (2 - Janssen risk series) 08/26/2020   INFLUENZA VACCINE  10/26/2023   Colonoscopy  07/31/2024 (Originally 10/24/2018)   MAMMOGRAM  02/28/2024   Cervical Cancer Screening (HPV/Pap Cotest)  05/04/2027   DTaP/Tdap/Td (2 - Td or Tdap) 01/18/2033   Hepatitis C Screening  Completed   HIV Screening  Completed   HPV VACCINES  Aged Out   Meningococcal B Vaccine  Aged Out      ----------------------------------------------------------------------------------------------------------------------------------------------------------------------------------------------------------------- Physical Exam BP 122/80 (BP Location: Left Arm, Patient Position: Sitting, Cuff Size: Large)   Pulse 76   Ht 5' 5 (1.651 m)   Wt 196 lb (88.9 kg)   LMP 11/10/2020 (Approximate)   SpO2 98%   BMI 32.62 kg/m   Physical Exam Constitutional:      Appearance: Normal appearance.  HENT:     Head: Normocephalic and atraumatic.  Cardiovascular:     Rate and Rhythm: Normal rate and regular rhythm.  Pulmonary:     Effort: Pulmonary effort is normal.     Breath sounds: Wheezing (end expiratory wheezing.) present.  Neurological:     Mental Status: She is alert.  Psychiatric:        Mood and Affect: Mood normal.        Behavior: Behavior normal.     ------------------------------------------------------------------------------------------------------------------------------------------------------------------------------------------------------------------- Assessment and Plan  Acute bronchitis Treating with prednisone  burst, albuterol  inhaler, and hydromet syrup.  Red flags reviewed.     Meds ordered this encounter  Medications   predniSONE  (DELTASONE ) 20 MG tablet    Sig: Take 1 tablet (20 mg total) by mouth 2 (two) times daily with a meal for 5 days.    Dispense:  10 tablet    Refill:  0   albuterol  (VENTOLIN  HFA) 108 (90 Base) MCG/ACT inhaler    Sig: Inhale 2 puffs into the lungs every 6 (six) hours as needed for wheezing or shortness of breath.    Dispense:  8 g    Refill:  0   HYDROcodone  bit-homatropine (HYDROMET) 5-1.5 MG/5ML syrup    Sig: Take 5 mLs by mouth every 6 (six) hours as needed for cough.    Dispense:  120 mL    Refill:  0    No follow-ups on file.

## 2023-11-14 NOTE — Assessment & Plan Note (Signed)
 Treating with prednisone  burst, albuterol  inhaler, and hydromet syrup.  Red flags reviewed.

## 2023-11-21 DIAGNOSIS — M21371 Foot drop, right foot: Secondary | ICD-10-CM | POA: Diagnosis not present

## 2023-11-21 DIAGNOSIS — M21372 Foot drop, left foot: Secondary | ICD-10-CM | POA: Diagnosis not present

## 2023-12-19 DIAGNOSIS — M79675 Pain in left toe(s): Secondary | ICD-10-CM | POA: Diagnosis not present

## 2023-12-19 DIAGNOSIS — L600 Ingrowing nail: Secondary | ICD-10-CM | POA: Diagnosis not present

## 2024-01-07 ENCOUNTER — Telehealth: Payer: Self-pay

## 2024-01-07 NOTE — Telephone Encounter (Signed)
 I called patient to see if she's available for surgery w/ Dr. Cleatus on 01/29/24 at [facility]. I left a voicemail requesting the patient call me back to schedule at [phone].

## 2024-01-08 ENCOUNTER — Telehealth: Payer: Self-pay

## 2024-01-08 NOTE — Telephone Encounter (Signed)
 I called patient to see if she's available for surgery w/ Dr. Cleatus on 01/29/24 at [facility]. I left a second voicemail requesting patient call me to schedule.

## 2024-01-18 ENCOUNTER — Encounter: Admitting: Physician Assistant

## 2024-01-22 ENCOUNTER — Ambulatory Visit (INDEPENDENT_AMBULATORY_CARE_PROVIDER_SITE_OTHER): Admitting: Physician Assistant

## 2024-01-22 VITALS — BP 119/72 | HR 71 | Wt 206.0 lb

## 2024-01-22 DIAGNOSIS — E0590 Thyrotoxicosis, unspecified without thyrotoxic crisis or storm: Secondary | ICD-10-CM

## 2024-01-22 DIAGNOSIS — Z0000 Encounter for general adult medical examination without abnormal findings: Secondary | ICD-10-CM | POA: Diagnosis not present

## 2024-01-22 DIAGNOSIS — G600 Hereditary motor and sensory neuropathy: Secondary | ICD-10-CM

## 2024-01-22 DIAGNOSIS — Z13220 Encounter for screening for lipoid disorders: Secondary | ICD-10-CM

## 2024-01-22 DIAGNOSIS — F17200 Nicotine dependence, unspecified, uncomplicated: Secondary | ICD-10-CM

## 2024-01-22 DIAGNOSIS — R7303 Prediabetes: Secondary | ICD-10-CM

## 2024-01-22 DIAGNOSIS — Z1211 Encounter for screening for malignant neoplasm of colon: Secondary | ICD-10-CM

## 2024-01-22 NOTE — Patient Instructions (Addendum)
 Cologuard will be sent to your house Labs to get done in next month  Health Maintenance, Female Adopting a healthy lifestyle and getting preventive care are important in promoting health and wellness. Ask your health care provider about: The right schedule for you to have regular tests and exams. Things you can do on your own to prevent diseases and keep yourself healthy. What should I know about diet, weight, and exercise? Eat a healthy diet  Eat a diet that includes plenty of vegetables, fruits, low-fat dairy products, and lean protein. Do not eat a lot of foods that are high in solid fats, added sugars, or sodium. Maintain a healthy weight Body mass index (BMI) is used to identify weight problems. It estimates body fat based on height and weight. Your health care provider can help determine your BMI and help you achieve or maintain a healthy weight. Get regular exercise Get regular exercise. This is one of the most important things you can do for your health. Most adults should: Exercise for at least 150 minutes each week. The exercise should increase your heart rate and make you sweat (moderate-intensity exercise). Do strengthening exercises at least twice a week. This is in addition to the moderate-intensity exercise. Spend less time sitting. Even light physical activity can be beneficial. Watch cholesterol and blood lipids Have your blood tested for lipids and cholesterol at 50 years of age, then have this test every 5 years. Have your cholesterol levels checked more often if: Your lipid or cholesterol levels are high. You are older than 50 years of age. You are at high risk for heart disease. What should I know about cancer screening? Depending on your health history and family history, you may need to have cancer screening at various ages. This may include screening for: Breast cancer. Cervical cancer. Colorectal cancer. Skin cancer. Lung cancer. What should I know about heart  disease, diabetes, and high blood pressure? Blood pressure and heart disease High blood pressure causes heart disease and increases the risk of stroke. This is more likely to develop in people who have high blood pressure readings or are overweight. Have your blood pressure checked: Every 3-5 years if you are 23-14 years of age. Every year if you are 81 years old or older. Diabetes Have regular diabetes screenings. This checks your fasting blood sugar level. Have the screening done: Once every three years after age 34 if you are at a normal weight and have a low risk for diabetes. More often and at a younger age if you are overweight or have a high risk for diabetes. What should I know about preventing infection? Hepatitis B If you have a higher risk for hepatitis B, you should be screened for this virus. Talk with your health care provider to find out if you are at risk for hepatitis B infection. Hepatitis C Testing is recommended for: Everyone born from [birth year] through 1965. Anyone with known risk factors for hepatitis C. Sexually transmitted infections (STIs) Get screened for STIs, including gonorrhea and chlamydia, if: You are sexually active and are younger than 50 years of age. You are older than 50 years of age and your health care provider tells you that you are at risk for this type of infection. Your sexual activity has changed since you were last screened, and you are at increased risk for chlamydia or gonorrhea. Ask your health care provider if you are at risk. Ask your health care provider about whether you are at high risk  for HIV. Your health care provider may recommend a prescription medicine to help prevent HIV infection. If you choose to take medicine to prevent HIV, you should first get tested for HIV. You should then be tested every 3 months for as long as you are taking the medicine. Pregnancy If you are about to stop having your period (premenopausal) and you may become  pregnant, seek counseling before you get pregnant. Take 400 to 800 micrograms (mcg) of folic acid every day if you become pregnant. Ask for birth control (contraception) if you want to prevent pregnancy. Osteoporosis and menopause Osteoporosis is a disease in which the bones lose minerals and strength with aging. This can result in bone fractures. If you are 70 years old or older, or if you are at risk for osteoporosis and fractures, ask your health care provider if you should: Be screened for bone loss. Take a calcium or vitamin D  supplement to lower your risk of fractures. Be given hormone replacement therapy (HRT) to treat symptoms of menopause. Follow these instructions at home: Alcohol use Do not drink alcohol if: Your health care provider tells you not to drink. You are pregnant, may be pregnant, or are planning to become pregnant. If you drink alcohol: Limit how much you have to: 0-1 drink a day. Know how much alcohol is in your drink. In the [place] one drink equals one 12 oz bottle of beer (355 mL), one 5 oz glass of wine (148 mL), or one 1 oz glass of hard liquor (44 mL). Lifestyle Do not use any products that contain nicotine or tobacco. These products include cigarettes, chewing tobacco, and vaping devices, such as e-cigarettes. If you need help quitting, ask your health care provider. Do not use street drugs. Do not share needles. Ask your health care provider for help if you need support or information about quitting drugs. General instructions Schedule regular health, dental, and eye exams. Stay current with your vaccines. Tell your health care provider if: You often feel depressed. You have ever been abused or do not feel safe at home. Summary Adopting a healthy lifestyle and getting preventive care are important in promoting health and wellness. Follow your health care provider's instructions about healthy diet, exercising, and getting tested or screened for  diseases. Follow your health care provider's instructions on monitoring your cholesterol and blood pressure. This information is not intended to replace advice given to you by your health care provider. Make sure you discuss any questions you have with your health care provider. Document Revised: 08/02/2020 Document Reviewed: 08/02/2020 Elsevier Patient Education  2024 [employer].

## 2024-01-22 NOTE — Progress Notes (Signed)
 "  Complete physical exam  Patient: Lauren Bowers   DOB: Feb 27, 1976   48 y.o. Female  MRN: [id number]  Subjective:    Chief Complaint  Patient presents with   Annual Exam   ..Discussed the use of AI scribe software for clinical note transcription with the patient, who gave verbal consent to proceed.  History of Present Illness Peni Lauren Bowers is a 48 year old female who presents for a complete physical exam.  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 - Daily use of orthotics and leg braces - Four falls earlier this year prior to current leg braces - No falls since starting current leg braces - Current braces are more comfortable and allow use of a variety of shoes - Considering adjustments to braces for improved ankle stability  Tobacco use disorder - Smokes 10 to 15 cigarettes per day - Resumed smoking after previous quit attempt with Chantix  - Previously reduced smoking to as low as one cigarette per day - Difficulty with smoking cessation despite prior efforts, does not want to be on chantix  because of the side effects  Physical activity and weight gain - Exercises regularly at the gym four days per week - Did not participate in a 5K event this year - Increased body weight  Sleep disturbance - Sleep improved with new mattress - Sleeps 6 to 8 hours per night - Occasional sleep interruption due to night sweats  Thyroid  disorder monitoring - Followed by endocrinology every six months for thyroid  monitoring - No prescription medications for thyroid  disease  Nutritional supplementation - Takes vitamin D , [employer] 19, and calcium supplements - No prescription medications  Preventive health maintenance - No history of colonoscopy and undecided about scheduling - Mammogram scheduled for January - No dental visit in seven years; plans to address five dental cavities  Auditory symptoms - Tinnitus present - Plans  to have tinnitus evaluated    Most recent fall risk assessment:    01/23/2024   12:55 PM  Fall Risk   Falls in the past year? 1  Number falls in past yr: 1  Injury with Fall? 0  Risk for fall due to : Impaired mobility;Impaired balance/gait  Follow up Falls evaluation completed;Education provided;Falls prevention discussed     Most recent depression screenings:    10/11/2023    2:48 PM 01/19/2023    7:51 AM  PHQ 2/9 Scores  PHQ - 2 Score 0 0  PHQ- 9 Score 2 1    Vision:Within last year and Dental: No current dental problems and No regular dental care   Patient Active Problem List   Diagnosis Date Noted   Acute bronchitis 11/14/2023   Sinusitis 08/23/2023   Family history of ovarian cancer 07/31/2023   Pre-diabetes 01/24/2023   Multiple thyroid  nodules 01/17/2023   Tobacco dependence 03/04/2021   Hives 03/02/2021   Hematuria 03/02/2021   Subclinical hyperthyroidism 01/24/2021   Status post cholecystectomy 01/19/2021   Gastroesophageal reflux disease without esophagitis 01/19/2021   Epigastric pain 01/19/2021   Right upper quadrant pain 01/19/2021   Nausea 01/19/2021   Stress 01/19/2021   Primary osteoarthritis of right knee 04/30/2020   Bilateral hearing loss 03/12/2020   Left thyroid  nodule 03/12/2020   Tinnitus of both ears 03/09/2020   Depressed mood 06/26/2018   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   Elevated fasting glucose 03/15/2018   Low TSH level 03/15/2018   Early menopause occurring in patient age younger than 68 years 03/12/2018  Class 1 obesity due to excess calories without serious comorbidity with body mass index (BMI) of 34.0 to 34.9 in adult 03/12/2018   Family history of Charcot-Marie-Tooth disease 03/12/2018   Neuropathy 03/12/2018   Depression 02/06/2014   Anxiety 02/06/2014   Encounter for smoking cessation counseling 02/06/2014   Past Medical History:  Diagnosis Date   Allergy    Anxiety    Depression    History of thyroid   nodule    Vaginal Pap smear, abnormal    Vitamin D  deficiency    Past Surgical History:  Procedure Laterality Date   CHOLECYSTECTOMY     Family History  Problem Relation Age of Onset   Cancer Mother        OVARIAN   Stroke Mother    Heart disease Father 19       AMI/stenting   Hypertension Father    Hyperlipidemia Father    Multiple sclerosis Father    Charcot-Marie-Tooth disease Father    Ovarian cancer Maternal Grandmother    Heart disease Maternal Grandfather    Breast cancer Other    Allergies  Allergen Reactions   Bactrim [Sulfamethoxazole-Trimethoprim] Hives   Methimazole Rash      Patient Care Team: Parminder Trapani L, PA-C as PCP - General (Family Medicine)   Outpatient Medications Prior to Visit  Medication Sig   albuterol  (VENTOLIN  HFA) 108 (90 Base) MCG/ACT inhaler Inhale 2 puffs into the lungs every 6 (six) hours as needed for wheezing or shortness of breath.   CALCIUM PO Take by mouth.   levOCARNitine (CARNITINE, L,) POWD 500 mg by Does not apply route.   Misc. Devices MISC AFO   VITAMIN D  PO Take 3,000 Units by mouth.   [DISCONTINUED] HYDROcodone  bit-homatropine (HYDROMET) 5-1.5 MG/5ML syrup Take 5 mLs by mouth every 6 (six) hours as needed for cough.   [DISCONTINUED] varenicline  (CHANTIX ) 1 MG tablet Take 1 tablet (1 mg total) by mouth 2 (two) times daily.   No facility-administered medications prior to visit.    Review of Systems  All other systems reviewed and are negative.         Objective:     BP 119/72 (Cuff Size: Normal)   Pulse 71   Wt 206 lb (93.4 kg)   LMP 11/10/2020 (Approximate)   SpO2 100%   BMI 34.28 kg/m  BP Readings from Last 3 Encounters:  01/23/24 119/72  11/14/23 122/80  10/11/23 124/82   Wt Readings from Last 3 Encounters:  01/23/24 206 lb (93.4 kg)  11/14/23 196 lb (88.9 kg)  10/11/23 197 lb (89.4 kg)      Physical Exam  BP 119/72 (Cuff Size: Normal)   Pulse 71   Wt 206 lb (93.4 kg)   LMP 11/10/2020  (Approximate)   SpO2 100%   BMI 34.28 kg/m   General Appearance:    Alert, cooperative, no distress, appears stated age  Head:    Normocephalic, without obvious abnormality, atraumatic  Eyes:    PERRL, conjunctiva/corneas clear, EOM's intact, fundi    benign, both eyes  Ears:    Normal TM's and external ear canals, both ears  Nose:   Nares normal, septum midline, mucosa normal, no drainage    or sinus tenderness  Throat:   Lips, mucosa, and tongue normal; teeth and gums normal  Neck:   Supple, symmetrical, trachea midline, no adenopathy;    thyroid :  no enlargement/tenderness/nodules; no carotid   bruit or JVD  Back:     Symmetric, no curvature,  ROM normal, no CVA tenderness  Lungs:     Clear to auscultation bilaterally, respirations unlabored  Chest Wall:    No tenderness or deformity   Heart:    Regular rate and rhythm, S1 and S2 normal, no murmur, rub   or gallop     Abdomen:     Soft, non-tender, bowel sounds active all four quadrants,    no masses, no organomegaly        Extremities:   Extremities normal, atraumatic, no cyanosis or edema. Bilateral lower extremities in leg braces.   Pulses:   2+ and symmetric all extremities  Skin:   Skin color, texture, turgor normal, no rashes or lesions  Lymph nodes:   Cervical, supraclavicular, and axillary nodes normal          Assessment & Plan:    Routine Health Maintenance and Physical Exam  Immunization History  Administered Date(s) Administered   Influenza, Seasonal, Injecte, Preservative Fre 01/19/2023   Janssen (J&J) SARS-COV-2 Vaccination 07/29/2020   Tdap 01/19/2023    Health Maintenance  Topic Date Due   COVID-19 Vaccine (2 - Janssen risk series) 02/07/2024 (Originally 08/26/2020)   Zoster Vaccines- Shingrix (1 of 2) 04/23/2024 (Originally 10/23/1992)   Influenza Vaccine  06/24/2024 (Originally 10/26/2023)   Colonoscopy  07/31/2024 (Originally 10/24/2018)   Pneumococcal Vaccine: 50+ Years (1 of 2 - PCV) 01/21/2025  (Originally 10/23/1992)   Hepatitis B Vaccines 19-59 Average Risk (1 of 3 - 19+ 3-dose series) 01/21/2025 (Originally 10/23/1992)   Mammogram  01/21/2025 (Originally 02/28/2024)   Cervical Cancer Screening (HPV/Pap Cotest)  05/04/2027   DTaP/Tdap/Td (2 - Td or Tdap) 01/18/2033   Hepatitis C Screening  Completed   HIV Screening  Completed   HPV VACCINES  Aged Out   Meningococcal B Vaccine  Aged Out    Discussed health benefits of physical activity, and encouraged her to engage in regular exercise appropriate for her age and condition. SABRAJacob was seen today for annual exam.  Diagnoses and all orders for this visit:  Physical exam, routine -     CBC with Differential/Platelet -     CMP14+EGFR -     Lipid panel -     VITAMIN D  25 Hydroxy (Vit-D Deficiency, Fractures) -     Vitamin B12 -     TSH + free T4 -     Hemoglobin A1c  Pre-diabetes -     Hemoglobin A1c  Subclinical hyperthyroidism -     TSH + free T4  CMT (Charcot-Marie-Tooth disease)  Screening for lipid disorders -     Lipid panel  Colon cancer screening -     Cologuard  Tobacco dependence    Assessment & Plan Nicotine dependence Continues smoking 10-15 cigarettes daily despite previous Varenicline  use. - Discussed alternative cessation methods: hypnosis, acupuncture, nicotine patches.  Prediabetes Previous A1c elevated. - Cancel current A1c appointment, include A1c in upcoming lab work.  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 No falls reported. - Inquire about braces with external bars for additional stability.  General Health Maintenance Discussed vaccinations, mammogram, colonoscopy, dental, and eye care. Mammogram scheduled. Open to Cologuard for colon cancer screening. Plans to address dental issues next year. Regular eye check-ups maintained. - Vitals look great - Send Cologuard kit for colon cancer screening. -  Encourage scheduling of dental appointment for cavity treatment. - Encourage continuation of regular eye examinations.  Presents with a bill for genetic testing and wants help  with cost. She states it would have been 150 dollars at her GYN.  - Will give to manager to see if anything can be done.     Return in about 1 year (around 01/21/2025).     Halina Asano, PA-C   "

## 2024-01-23 ENCOUNTER — Encounter: Payer: Self-pay | Admitting: Physician Assistant

## 2024-01-31 DIAGNOSIS — G600 Hereditary motor and sensory neuropathy: Secondary | ICD-10-CM | POA: Diagnosis not present

## 2024-02-01 ENCOUNTER — Other Ambulatory Visit: Payer: Self-pay | Admitting: Physician Assistant

## 2024-02-01 DIAGNOSIS — Z716 Tobacco abuse counseling: Secondary | ICD-10-CM

## 2024-02-04 ENCOUNTER — Ambulatory Visit: Admitting: Physician Assistant

## 2024-02-25 DIAGNOSIS — R7303 Prediabetes: Secondary | ICD-10-CM | POA: Diagnosis not present

## 2024-02-25 DIAGNOSIS — Z0000 Encounter for general adult medical examination without abnormal findings: Secondary | ICD-10-CM | POA: Diagnosis not present

## 2024-02-25 DIAGNOSIS — E0590 Thyrotoxicosis, unspecified without thyrotoxic crisis or storm: Secondary | ICD-10-CM | POA: Diagnosis not present

## 2024-02-25 DIAGNOSIS — Z13220 Encounter for screening for lipoid disorders: Secondary | ICD-10-CM | POA: Diagnosis not present

## 2024-02-26 ENCOUNTER — Ambulatory Visit: Payer: Self-pay | Admitting: Physician Assistant

## 2024-02-26 LAB — CBC WITH DIFFERENTIAL/PLATELET
Basophils Absolute: 0.1 x10E3/uL (ref 0.0–0.2)
Basos: 1 %
EOS (ABSOLUTE): 0.2 x10E3/uL (ref 0.0–0.4)
Eos: 2 %
Hematocrit: 44.9 % (ref 34.0–46.6)
Hemoglobin: 15 g/dL (ref 11.1–15.9)
Immature Grans (Abs): 0 x10E3/uL (ref 0.0–0.1)
Immature Granulocytes: 0 %
Lymphocytes Absolute: 2.6 x10E3/uL (ref 0.7–3.1)
Lymphs: 35 %
MCH: 31.4 pg (ref 26.6–33.0)
MCHC: 33.4 g/dL (ref 31.5–35.7)
MCV: 94 fL (ref 79–97)
Monocytes Absolute: 0.5 x10E3/uL (ref 0.1–0.9)
Monocytes: 7 %
Neutrophils Absolute: 4.1 x10E3/uL (ref 1.4–7.0)
Neutrophils: 55 %
Platelets: 284 x10E3/uL (ref 150–450)
RBC: 4.77 x10E6/uL (ref 3.77–5.28)
RDW: 12.1 % (ref 11.7–15.4)
WBC: 7.4 x10E3/uL (ref 3.4–10.8)

## 2024-02-26 LAB — CMP14+EGFR
ALT: 11 IU/L (ref 0–32)
AST: 17 IU/L (ref 0–40)
Albumin: 4.4 g/dL (ref 3.9–4.9)
Alkaline Phosphatase: 87 IU/L (ref 41–116)
BUN/Creatinine Ratio: 27 — ABNORMAL HIGH (ref 9–23)
BUN: 17 mg/dL (ref 6–24)
Bilirubin Total: 0.4 mg/dL (ref 0.0–1.2)
CO2: 21 mmol/L (ref 20–29)
Calcium: 9.3 mg/dL (ref 8.7–10.2)
Chloride: 102 mmol/L (ref 96–106)
Creatinine, Ser: 0.63 mg/dL (ref 0.57–1.00)
Globulin, Total: 2.4 g/dL (ref 1.5–4.5)
Glucose: 95 mg/dL (ref 70–99)
Potassium: 4.4 mmol/L (ref 3.5–5.2)
Sodium: 139 mmol/L (ref 134–144)
Total Protein: 6.8 g/dL (ref 6.0–8.5)
eGFR: 108 mL/min/1.73 (ref >59)

## 2024-02-26 LAB — LIPID PANEL
Chol/HDL Ratio: 3.4 ratio (ref 0.0–4.4)
Cholesterol, Total: 189 mg/dL (ref 100–199)
HDL: 55 mg/dL (ref >39)
LDL Chol Calc (NIH): 112 mg/dL — ABNORMAL HIGH (ref 0–99)
Triglycerides: 122 mg/dL (ref 0–149)
VLDL Cholesterol Cal: 22 mg/dL (ref 5–40)

## 2024-02-26 LAB — HEMOGLOBIN A1C
Est. average glucose Bld gHb Est-mCnc: 114 mg/dL
Hgb A1c MFr Bld: 5.6 % (ref 4.8–5.6)

## 2024-02-26 LAB — TSH+FREE T4
Free T4: 1.37 ng/dL (ref 0.82–1.77)
TSH: 0.405 u[IU]/mL — ABNORMAL LOW (ref 0.450–4.500)

## 2024-02-26 LAB — VITAMIN D 25 HYDROXY (VIT D DEFICIENCY, FRACTURES): Vit D, 25-Hydroxy: 44.5 ng/mL (ref 30.0–100.0)

## 2024-02-26 LAB — VITAMIN B12: Vitamin B-12: 538 pg/mL (ref 232–1245)

## 2024-02-26 NOTE — Progress Notes (Signed)
 [name],   A1C normal but more elevated than 1 year ago.  Continue to watch sugars and carbs.  B12 normal.  Vitamin D  normal.  TSH trending low but free levels are good. Recheck in 3 months.  LdL up some from 1 year ago. 10 year cardiovascular risk is low. Continue to work on low fat diet.   Lauren Bowers.The 10-year ASCVD risk score (Arnett DK, et al., 2019) is: 2.5%   Values used to calculate the score:     Age: 48 years     Clincally relevant sex: Female     Is Non-Hispanic African American: No     Diabetic: No     Tobacco smoker: Yes     Systolic Blood Pressure: 112 mmHg     Is BP treated: No     HDL Cholesterol: 55 mg/dL     Total Cholesterol: 189 mg/dL

## 2024-03-05 DIAGNOSIS — N951 Menopausal and female climacteric states: Secondary | ICD-10-CM | POA: Diagnosis not present

## 2024-03-05 DIAGNOSIS — E042 Nontoxic multinodular goiter: Secondary | ICD-10-CM | POA: Diagnosis not present

## 2024-03-05 DIAGNOSIS — E0590 Thyrotoxicosis, unspecified without thyrotoxic crisis or storm: Secondary | ICD-10-CM | POA: Diagnosis not present

## 2025-01-27 ENCOUNTER — Encounter: Admitting: Physician Assistant
